# Patient Record
Sex: Male | Born: 1940 | Race: Black or African American | Hispanic: No | Smoking: Former smoker
Health system: Southern US, Community
[De-identification: ages and names within clinical notes are randomized; demographics above are authoritative.]

## PROBLEM LIST (undated history)

## (undated) DIAGNOSIS — J45909 Unspecified asthma, uncomplicated: Secondary | ICD-10-CM

## (undated) DIAGNOSIS — J439 Emphysema, unspecified: Secondary | ICD-10-CM

## (undated) DIAGNOSIS — J449 Chronic obstructive pulmonary disease, unspecified: Secondary | ICD-10-CM

## (undated) DIAGNOSIS — Z87891 Personal history of nicotine dependence: Secondary | ICD-10-CM

## (undated) DIAGNOSIS — I10 Essential (primary) hypertension: Secondary | ICD-10-CM

## (undated) HISTORY — PX: HEMORRHOID SURGERY: SHX153

## (undated) HISTORY — DX: Personal history of nicotine dependence: Z87.891

## (undated) HISTORY — PX: OTHER SURGICAL HISTORY: SHX169

## (undated) HISTORY — DX: Emphysema, unspecified: J43.9

---

## 2014-08-02 ENCOUNTER — Ambulatory Visit: Payer: Self-pay | Admitting: Specialist

## 2014-08-02 LAB — BASIC METABOLIC PANEL
Anion Gap: 7 (ref 7–16)
BUN: 24 mg/dL — AB (ref 7–18)
CALCIUM: 9.3 mg/dL (ref 8.5–10.1)
CO2: 26 mmol/L (ref 21–32)
CREATININE: 1.29 mg/dL (ref 0.60–1.30)
Chloride: 104 mmol/L (ref 98–107)
EGFR (African American): >60
EGFR (Non-African Amer.): 58 — ABNORMAL LOW
Glucose: 86 mg/dL (ref 65–99)
Osmolality: 277 (ref 275–301)
Potassium: 4.5 mmol/L (ref 3.5–5.1)
Sodium: 137 mmol/L (ref 136–145)

## 2014-08-02 LAB — CBC
HCT: 39.8 % — ABNORMAL LOW (ref 40.0–52.0)
HGB: 12.9 g/dL — ABNORMAL LOW (ref 13.0–18.0)
MCH: 29.2 pg (ref 26.0–34.0)
MCHC: 32.5 g/dL (ref 32.0–36.0)
MCV: 90 fL (ref 80–100)
Platelet: 330 10*3/uL (ref 150–440)
RBC: 4.42 10*6/uL (ref 4.40–5.90)
RDW: 14.5 % (ref 11.5–14.5)
WBC: 9.1 10*3/uL (ref 3.8–10.6)

## 2014-08-02 LAB — URINALYSIS, COMPLETE
BILIRUBIN, UR: NEGATIVE
BLOOD: NEGATIVE
Bacteria: NONE SEEN
GLUCOSE, UR: NEGATIVE mg/dL (ref 0–75)
Ketone: NEGATIVE
LEUKOCYTE ESTERASE: NEGATIVE
Nitrite: NEGATIVE
PROTEIN: NEGATIVE
Ph: 5 (ref 4.5–8.0)
SPECIFIC GRAVITY: 1.012 (ref 1.003–1.030)
SQUAMOUS EPITHELIAL: NONE SEEN

## 2014-08-02 LAB — PROTIME-INR
INR: 1
PROTHROMBIN TIME: 13.2 s (ref 11.5–14.7)

## 2014-08-02 LAB — APTT: Activated PTT: 29.1 secs (ref 23.6–35.9)

## 2014-08-02 LAB — MRSA PCR SCREENING

## 2014-08-07 DIAGNOSIS — R0789 Other chest pain: Secondary | ICD-10-CM | POA: Insufficient documentation

## 2014-08-16 ENCOUNTER — Inpatient Hospital Stay: Payer: Self-pay | Admitting: Specialist

## 2014-08-16 DIAGNOSIS — I10 Essential (primary) hypertension: Secondary | ICD-10-CM

## 2014-08-16 DIAGNOSIS — Z01810 Encounter for preprocedural cardiovascular examination: Secondary | ICD-10-CM

## 2014-08-16 LAB — BASIC METABOLIC PANEL
Anion Gap: 6 — ABNORMAL LOW (ref 7–16)
BUN: 20 mg/dL — AB (ref 7–18)
CHLORIDE: 109 mmol/L — AB (ref 98–107)
CREATININE: 1.33 mg/dL — AB (ref 0.60–1.30)
Calcium, Total: 7.9 mg/dL — ABNORMAL LOW (ref 8.5–10.1)
Co2: 27 mmol/L (ref 21–32)
EGFR (Non-African Amer.): 56 — ABNORMAL LOW
GLUCOSE: 89 mg/dL (ref 65–99)
Osmolality: 285 (ref 275–301)
Potassium: 4.3 mmol/L (ref 3.5–5.1)
Sodium: 142 mmol/L (ref 136–145)

## 2014-08-16 LAB — HEMOGLOBIN
HGB: 9.2 g/dL — AB (ref 13.0–18.0)
HGB: 9.3 g/dL — ABNORMAL LOW (ref 13.0–18.0)

## 2014-08-16 LAB — PLATELET COUNT: Platelet: 234 10*3/uL (ref 150–440)

## 2014-08-17 LAB — HEMOGLOBIN: HGB: 8.5 g/dL — AB (ref 13.0–18.0)

## 2014-08-18 DIAGNOSIS — R0902 Hypoxemia: Secondary | ICD-10-CM | POA: Diagnosis not present

## 2014-08-18 DIAGNOSIS — J8410 Pulmonary fibrosis, unspecified: Secondary | ICD-10-CM

## 2014-08-18 LAB — HEMOGLOBIN: HGB: 10.6 g/dL — ABNORMAL LOW (ref 13.0–18.0)

## 2014-08-22 ENCOUNTER — Emergency Department: Payer: Self-pay | Admitting: Emergency Medicine

## 2014-08-22 LAB — CBC WITH DIFFERENTIAL/PLATELET
BASOS ABS: 0.1 10*3/uL (ref 0.0–0.1)
Basophil %: 0.5 %
EOS PCT: 2.3 %
Eosinophil #: 0.2 10*3/uL (ref 0.0–0.7)
HCT: 31.7 % — AB (ref 40.0–52.0)
HGB: 10.3 g/dL — ABNORMAL LOW (ref 13.0–18.0)
LYMPHS ABS: 2.2 10*3/uL (ref 1.0–3.6)
Lymphocyte %: 20.8 %
MCH: 30 pg (ref 26.0–34.0)
MCHC: 32.5 g/dL (ref 32.0–36.0)
MCV: 92 fL (ref 80–100)
MONO ABS: 1.2 x10 3/mm — AB (ref 0.2–1.0)
Monocyte %: 11.1 %
Neutrophil #: 7 10*3/uL — ABNORMAL HIGH (ref 1.4–6.5)
Neutrophil %: 65.3 %
PLATELETS: 368 10*3/uL (ref 150–440)
RBC: 3.44 10*6/uL — ABNORMAL LOW (ref 4.40–5.90)
RDW: 13.5 % (ref 11.5–14.5)
WBC: 10.8 10*3/uL — AB (ref 3.8–10.6)

## 2015-01-29 ENCOUNTER — Inpatient Hospital Stay
Admit: 2015-01-29 | Disposition: A | Discharge status: Home / Self Care | Payer: Self-pay | Attending: Internal Medicine | Admitting: Internal Medicine

## 2015-01-29 LAB — COMPREHENSIVE METABOLIC PANEL
ALT: 18 U/L
AST: 59 U/L — AB
Albumin: 2.8 g/dL — ABNORMAL LOW
Alkaline Phosphatase: 32 U/L — ABNORMAL LOW
Anion Gap: 18 — ABNORMAL HIGH (ref 7–16)
BUN: 70 mg/dL — AB
Bilirubin,Total: 0.3 mg/dL
Calcium, Total: 8.5 mg/dL — ABNORMAL LOW
Chloride: 106 mmol/L
Co2: 14 mmol/L — ABNORMAL LOW
Creatinine: 2.38 mg/dL — ABNORMAL HIGH
GFR CALC AF AMER: 30 — AB
GFR CALC NON AF AMER: 26 — AB
Glucose: 160 mg/dL — ABNORMAL HIGH
Potassium: 4.8 mmol/L
SODIUM: 138 mmol/L
TOTAL PROTEIN: 5.2 g/dL — AB

## 2015-01-29 LAB — HEMOGLOBIN
HGB: 9 g/dL — ABNORMAL LOW (ref 13.0–18.0)
HGB: 7.5 g/dL — AB (ref 13.0–18.0)
HGB: 8.9 g/dL — ABNORMAL LOW (ref 13.0–18.0)

## 2015-01-29 LAB — CBC
HCT: 20.4 % — AB (ref 40.0–52.0)
HGB: 6.2 g/dL — AB (ref 13.0–18.0)
MCH: 25.8 pg — AB (ref 26.0–34.0)
MCHC: 30.5 g/dL — ABNORMAL LOW (ref 32.0–36.0)
MCV: 84 fL (ref 80–100)
Platelet: 211 10*3/uL (ref 150–440)
RBC: 2.42 10*6/uL — AB (ref 4.40–5.90)
RDW: 19 % — AB (ref 11.5–14.5)
WBC: 13.9 10*3/uL — AB (ref 3.8–10.6)

## 2015-01-29 LAB — CK TOTAL AND CKMB (NOT AT ARMC)
CK, Total: 217 U/L
CK, Total: 243 U/L
CK-MB: 5.9 ng/mL — ABNORMAL HIGH
CK-MB: 6.8 ng/mL — AB

## 2015-01-29 LAB — TROPONIN I
TROPONIN-I: 0.16 ng/mL — AB
TROPONIN-I: 0.17 ng/mL — AB

## 2015-01-29 LAB — APTT: ACTIVATED PTT: 26 s (ref 23.6–35.9)

## 2015-01-29 LAB — PROTIME-INR
INR: 1.3
Prothrombin Time: 15.9 secs — ABNORMAL HIGH

## 2015-01-30 LAB — COMPREHENSIVE METABOLIC PANEL
ALK PHOS: 26 U/L — AB
ANION GAP: 4 — AB (ref 7–16)
Albumin: 2.7 g/dL — ABNORMAL LOW
BILIRUBIN TOTAL: 0.6 mg/dL
BUN: 57 mg/dL — ABNORMAL HIGH
CALCIUM: 7.6 mg/dL — AB
CO2: 20 mmol/L — AB
Chloride: 115 mmol/L — ABNORMAL HIGH
Creatinine: 1.54 mg/dL — ABNORMAL HIGH
EGFR (African American): 51 — ABNORMAL LOW
GFR CALC NON AF AMER: 44 — AB
Glucose: 91 mg/dL
POTASSIUM: 4 mmol/L
SGOT(AST): 22 U/L
SGPT (ALT): 13 U/L — ABNORMAL LOW
Sodium: 139 mmol/L
Total Protein: 4.6 g/dL — ABNORMAL LOW

## 2015-01-30 LAB — CBC WITH DIFFERENTIAL/PLATELET
Basophil #: 0 10*3/uL (ref 0.0–0.1)
Basophil %: 0.3 %
EOS ABS: 0 10*3/uL (ref 0.0–0.7)
EOS PCT: 0.3 %
HCT: 25 % — AB (ref 40.0–52.0)
HGB: 8.2 g/dL — ABNORMAL LOW (ref 13.0–18.0)
LYMPHS ABS: 2.6 10*3/uL (ref 1.0–3.6)
Lymphocyte %: 21.3 %
MCH: 27.4 pg (ref 26.0–34.0)
MCHC: 32.7 g/dL (ref 32.0–36.0)
MCV: 84 fL (ref 80–100)
MONOS PCT: 9.2 %
Monocyte #: 1.1 x10 3/mm — ABNORMAL HIGH (ref 0.2–1.0)
Neutrophil #: 8.5 10*3/uL — ABNORMAL HIGH (ref 1.4–6.5)
Neutrophil %: 68.9 %
Platelet: 143 10*3/uL — ABNORMAL LOW (ref 150–440)
RBC: 2.98 10*6/uL — AB (ref 4.40–5.90)
RDW: 17.9 % — AB (ref 11.5–14.5)
WBC: 12.4 10*3/uL — AB (ref 3.8–10.6)

## 2015-01-30 LAB — HEMOGLOBIN
HGB: 7.7 g/dL — AB (ref 13.0–18.0)
HGB: 10 g/dL — ABNORMAL LOW (ref 13.0–18.0)
HGB: 10.5 g/dL — ABNORMAL LOW (ref 13.0–18.0)

## 2015-01-30 LAB — CK TOTAL AND CKMB (NOT AT ARMC)
CK, TOTAL: 245 U/L
CK-MB: 6.4 ng/mL — AB

## 2015-01-30 LAB — HEMATOCRIT
HCT: 23.7 % — ABNORMAL LOW (ref 40.0–52.0)
HCT: 30.6 % — ABNORMAL LOW (ref 40.0–52.0)

## 2015-01-30 LAB — TROPONIN I: Troponin-I: 0.17 ng/mL — ABNORMAL HIGH

## 2015-01-31 LAB — HEMOGLOBIN: HGB: 9.3 g/dL — AB (ref 13.0–18.0)

## 2015-01-31 LAB — CBC WITH DIFFERENTIAL/PLATELET
Basophil #: 0.1 10*3/uL (ref 0.0–0.1)
Basophil %: 0.5 %
EOS PCT: 2.9 %
Eosinophil #: 0.3 10*3/uL (ref 0.0–0.7)
HCT: 26.7 % — AB (ref 40.0–52.0)
HGB: 9 g/dL — ABNORMAL LOW (ref 13.0–18.0)
LYMPHS ABS: 3.3 10*3/uL (ref 1.0–3.6)
Lymphocyte %: 27.8 %
MCH: 28.1 pg (ref 26.0–34.0)
MCHC: 33.9 g/dL (ref 32.0–36.0)
MCV: 83 fL (ref 80–100)
MONO ABS: 1 x10 3/mm (ref 0.2–1.0)
MONOS PCT: 8.8 %
NEUTROS ABS: 7 10*3/uL — AB (ref 1.4–6.5)
Neutrophil %: 60 %
Platelet: 152 10*3/uL (ref 150–440)
RBC: 3.22 10*6/uL — ABNORMAL LOW (ref 4.40–5.90)
RDW: 17.4 % — ABNORMAL HIGH (ref 11.5–14.5)
WBC: 11.7 10*3/uL — ABNORMAL HIGH (ref 3.8–10.6)

## 2015-01-31 LAB — BASIC METABOLIC PANEL
Anion Gap: 8 (ref 7–16)
BUN: 31 mg/dL — ABNORMAL HIGH
CO2: 21 mmol/L — AB
Calcium, Total: 7.7 mg/dL — ABNORMAL LOW
Chloride: 111 mmol/L
Creatinine: 1.33 mg/dL — ABNORMAL HIGH
EGFR (African American): >60
EGFR (Non-African Amer.): 53 — ABNORMAL LOW
Glucose: 56 mg/dL — ABNORMAL LOW
Potassium: 3.4 mmol/L — ABNORMAL LOW
Sodium: 140 mmol/L

## 2015-02-01 LAB — HEMOGLOBIN: HGB: 10 g/dL — AB (ref 13.0–18.0)

## 2015-02-02 LAB — HEMOGLOBIN: HGB: 10.2 g/dL — ABNORMAL LOW (ref 13.0–18.0)

## 2015-02-04 NOTE — Consult Note (Signed)
PATIENT NAME:  Jonathan Howell, Jonathan Howell MR#:  [id number] DATE OF BIRTH:  Sep 21, 1941  DATE OF CONSULTATION:  08/16/2014  REFERRING PHYSICIAN:  Margaretmary Eddy, MD CONSULTING PHYSICIAN:  Theodoro Grist, MD  PRIMARY CARE PHYSICIAN: Dr. Kendall Flack  REASON FOR CONSULTATION: Hypotension.   HISTORY OF PRESENT ILLNESS: The patient is a 74 year old African American male with past medical history significant for history of right hip severe degenerative arthritis who just underwent total hip joint replacement on the 3rd of November 2015. He was noted to be hypotensive in postoperative period. Initially, apparently, his vital signs were good with blood pressure around 120s and heart rate of 95 prior to going to the operating room; however, after operation the patient was noted to be hypotensive with systolic blood pressure in 60s. He was given 3600 mL of IV fluids. He had 800 mL of output, 150 of them were in Autovac and he had 75 to 50 mL of urine output during the operation time and postoperative period in PAC-U. Here now on the floor, he has been on the floor for approximately 1 hour, and his urine output is around 27 mL. Hospitalist services were contacted for evaluation as the patient's blood pressure remains 89/59 with heart rate of 115 and respiration rate of 14 with O2 sats of 95. The patient himself tells me that his blood pressure usually runs quite low, around 90s to 100s. He denies any shortness of breath, chest pains, lightheadedness or dizziness, or any other symptoms.   PAST MEDICAL HISTORY: Significant for history of right hip severe degenerative arthritis for which he just underwent total right hip replacement, depression, hyperlipidemia, and joint pain. Apparently, the patient had left total hip replacement approximately 7 years ago for degenerative arthritis as well.   HOME MEDICATIONS: Fluoxetine 10 mg p.o. twice daily, Norco 5/325 mg 1 tablet every 3 to 4 hours as needed, Seroquel 300 mg p.o. at bedtime.    ALLERGIES: No known drug allergies.   PAST SURGICAL HISTORY: As above.   FAMILY HISTORY: Diabetes mellitus in family, also the patient's mother had breast carcinoma. No other coronary artery disease, strokes, or hypertension in the family.   SOCIAL HISTORY: The patient is a smoker, smokes approximately 1 pack in 3 days, has been smoking for 50 or 60 years. No alcohol abuse. Used to work in [employer].   REVIEW OF SYSTEMS: Positive for intermittent cough as well as wheezes as well as white phlegm.  CONSTITUTIONAL: Denies any fevers, chills, fatigue, weakness, and pain, except for now postoperatively. No weight loss or gain.  EYES: Denies any blurry vision, double vision, glaucoma or cataracts.  ENT: Denies any tinnitus, allergies, epistaxis, sinus pain, dentures, difficulty swallowing. RESPIRATORY: Admits of cough and wheezes. Denies any hemoptysis. Denies any shortness of breath. Denies any COPD. CARDIOVASCULAR: Denies chest pains, orthopnea, edema, arrhythmias, palpitations or syncope.  GASTROINTESTINAL: Denies nausea, vomiting, diarrhea, or constipation.  GENITOURINARY: Denies dysuria, hematuria, frequency or incontinence.  ENDOCRINE: Denies any polydipsia, nocturia, thyroid problems, heat or cold intolerance or thirst.  HEMATOLOGIC: Denies anemia, easy bruising, bleeding or swollen glands.  SKIN: Denies any acne, rash, lesions, or change in moles.  MUSCULOSKELETAL: Denies arthritis, cramps or swelling. NEUROLOGIC: No numbness, epilepsy, tremors.  PSYCHIATRIC: Denies anxiety, insomnia, or depression.  PHYSICAL EXAMINATION:  VITAL SIGNS: During my evaluation, the patient's temperature is 97.7, pulse 102, respirations 18, blood pressure 106/73, and saturation was 91% on room air. Just half an hour ago, the patient's systolic blood pressure was 88/64.  GENERAL: This  is a well-developed, well-nourished [place] American male in no significant distress, lying on the stretcher.  HEENT: His  pupils are equal and reactive to light. Extraocular movements intact. No scleral icterus or conjunctivitis. Has normal hearing. No pharyngeal erythema. Mucosa is moist.  NECK: No masses. Supple and nontender. Thyroid is not enlarged. No adenopathy or JVD or carotid bruits bilaterally. Full range of motion.  LUNGS: Diminished breath sounds bilaterally. No rales or rhonchi; however, the patient does have significant wheezing, especially on the right side. No significant labored inspirations, increased effort. No dullness to percussion. Not in overt respiratory distress.  HEART: S1, S2 appreciated. Rhythm was regular. PMI not lateralized. Chest is nontender to palpation. 1+ pedal pulses. EXTREMITIES: No lower extremity edema, calf tenderness or cyanosis was noted.  ABDOMEN: Soft, nontender. Positive bowel sounds. No hepatosplenomegaly or masses were noted. RECTAL: Deferred.  MUSCLE STRENGTH: Not able to move his right lower extremity; however, all other extremities are functioning well. No cyanosis. Not able to assess for kyphosis. The patient does have Autovac with 150 mL of blood.  SKIN: Did not reveal any rashes, lesions, erythema, nodularity or induration. It was warm and dry to palpation.  LYMPHATIC: No adenopathy in the cervical region.  NEUROLOGICAL: Cranial nerves grossly intact. Sensory is intact. No dysarthria or aphasia. The patient is alert, oriented to time, person and place, cooperative. Memory is good.  PSYCHIATRIC: No significant confusion, agitation, or depression noted.  DIAGNOSTIC DATA: No labs yet available.   Radiologic studies: Right hip, 1 view x-ray, 3rd of November 2015, status post hip replacement, showed right hip joint prosthesis placement without evidence of immediate postprocedure complications.   Pelvis AP only, 3rd of November 2015, the same.  ASSESSMENT AND PLAN: 1.  Hypotension. Very likely secondary to intravascular volume contraction, questionable intraoperative  bleeding also spinal anesthesia in the setting of chronic hypotension with tachycardia, with oliguria of approximately 27 mL an hour of urinary output. We will start the patient on IV fluids. We will give him bolus. Will transfuse blood as needed. We will get hemoglobin level stat now and we will crossmatch and transfuse if needed.  2.  Depression. We will continue Seroquel as well as fluoxetine. 3.  Hyperlipidemia. Low-fat diet. 4.  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 Will initiate the patient on DuoNebs and tiotropium. 5.  Tobacco abuse. Discussed cessation for approximately 5 minutes. Nicotine replacement therapy will be initiated.   TIME SPENT: 50 minutes.  ____________________________ Theodoro Grist, MD rv:sb D: 08/16/2014 13:39:15 ET T: 08/16/2014 14:36:12 ET JOB#: [id number]  cc: Theodoro Grist, MD, <Dictator> Leyton Magoon MD ELECTRONICALLY SIGNED 09/12/2014 21:07

## 2015-02-04 NOTE — Consult Note (Signed)
Brief Consult Note: Diagnosis: hypotension, s/p R total hip arthroplasty 11.3.15 by DR C. Smith, h/o depression, hyperlipidemia, ongoing tobacco abuse.   Patient was seen by consultant.   Consult note dictated.   Recommend further assessment or treatment.   Orders entered.   Comments: 1. HYpotension, poss related to intravascular volume contraction, ? intraoperative bleeding, spinal anestesia in the setting on chronic hypotension, with tachycardia, relative oliguria of 27 ml/hour urinary output, start IVF, bolus, transfuse blood if needed, crossmatch, get stat HGb  2.depression, contineu seroquel, fluoxetine 3. hyperlipidemia, low fat diet 4 wheezing, suspect COPD due to tobacco abuse, duonebs, tiotropium, O2 prn 5. tobacco abuse, d/w pt for about 5 minutes, nicotine replacement will be initiated Thanks for consult, will follow.  Electronic Signatures: Theodoro Grist (MD)  (Signed [phone] 13:40)  Authored: Brief Consult Note   Last Updated: 03-Nov-15 13:40 by Theodoro Grist (MD)

## 2015-02-04 NOTE — Op Note (Signed)
PATIENT NAME:  Jonathan Howell, Jonathan Howell MR#:  [id number] DATE OF BIRTH:  03-May-1941  DATE OF PROCEDURE:  08/16/2014  PREOPERATIVE DIAGNOSIS: Severe degenerative arthritis, right hip.  POSTOPERATIVE DIAGNOSIS: Severe degenerative arthritis, right hip.  PROCEDURE: Right total hip arthroplasty.   SURGEON: Christophe Louis, M.D.   ASSISTANT: Earnestine Leys, M.D.   ANESTHESIA: Spinal.   ESTIMATED BLOOD LOSS: 500 mL.   DRAINS: Two Autovac drains.   DESCRIPTION OF PROCEDURE: Two grams of Ancef was given intravenously prior to the procedure. Spinal anesthesia is induced. The patient is turned into the left lateral decubitus position and secured in the usual manner for right total hip arthroplasty. The right hip and leg are thoroughly prepped with alcohol and ChloraPrep and draped in standard sterile fashion. A standard posterolateral incision is made at the hip and the dissection carried down and the fascia lata incised in the line of the skin incision. The trochanteric bursa is excised. The sciatic nerve is palpated deep within the wound and protected throughout the case. Piriformis tendon is identified and cut and tagged. The remainder of the external rotators are cut with the coagulation Bovie. The capsule is thoroughly then dissected out using the periosteal elevator. It is then cut off of the femur in a T fashion and the ends are tagged with 0 Tycron. The hip is then easily dislocated and the femoral head cut off at the appropriate angle and length. The femoral head measures 51 mm. Acetabular retractors are placed, and the acetabulum is serially reamed to 53 mm and then the 54 mm tri-spike porous-coated AML acetabulum is impacted into place and seen to be in good position. Trial liner is inserted. The proximal femur is then reamed in standard fashion up to 11.5 mm in diameter. Broaches are used up to 12 and then trial reduction is performed with a -2 neck and this is seen to be the appropriate fit. All trial  components are removed. The wound is thoroughly irrigated multiple times throughout the case. Hole eliminator is placed. The acetabular liner with a 10 degree build-up is placed with the build-up posterolaterally. The small stature 12 mm AML porous-coated femoral component is then impacted into position and seen to be a good fit. The -2 ball is placed on the neck and impacted and then the hip is reduced and is seen to be stable and satisfactory with equal leg lengths. The joint is thoroughly irrigated multiple times. The capsule is meticulously repaired with zero Tycron and then resecured to the abductor mechanism. The piriformis tendon is resecured to the abductor mechanism. Two Autovac drains are brought out through separate stab wound incisions. The fascia lata is repaired with 0 Tycron, subcutaneous tissue closed with 2-0 Vicryl and the skin stapler is used to close the skin. A soft bulky dressing is applied. The patient is turned onto the hospital bed and placed in an abduction pillow and returned to the recovery room in satisfactory condition having tolerated the procedure quite well.  ____________________________ Lucas Mallow, MD ces:sb D: 08/16/2014 10:38:23 ET T: 08/16/2014 11:19:31 ET JOB#: [id number]  cc: Lucas Mallow, MD, <Dictator> Lucas Mallow MD ELECTRONICALLY SIGNED 08/17/2014 8:40

## 2015-02-06 LAB — SURGICAL PATHOLOGY

## 2015-02-07 ENCOUNTER — Ambulatory Visit
Admit: 2015-02-07 | Disposition: A | Discharge status: Home / Self Care | Payer: Self-pay | Attending: Family Medicine | Admitting: Family Medicine

## 2015-02-09 ENCOUNTER — Emergency Department
Admit: 2015-02-09 | Disposition: A | Discharge status: Home / Self Care | Payer: Self-pay | Admitting: Emergency Medicine

## 2015-02-09 LAB — BASIC METABOLIC PANEL
Anion Gap: 8 (ref 7–16)
BUN: 17 mg/dL
CHLORIDE: 103 mmol/L
Calcium, Total: 8.8 mg/dL — ABNORMAL LOW
Co2: 25 mmol/L
Creatinine: 1.15 mg/dL
EGFR (African American): >60
EGFR (Non-African Amer.): >60
Glucose: 98 mg/dL
Potassium: 3.5 mmol/L
Sodium: 136 mmol/L

## 2015-02-09 LAB — CBC WITH DIFFERENTIAL/PLATELET
BASOS ABS: 0.1 10*3/uL (ref 0.0–0.1)
Basophil %: 1.4 %
EOS PCT: 3.5 %
Eosinophil #: 0.3 10*3/uL (ref 0.0–0.7)
HCT: 32.7 % — ABNORMAL LOW (ref 40.0–52.0)
HGB: 10.8 g/dL — ABNORMAL LOW (ref 13.0–18.0)
LYMPHS ABS: 2.4 10*3/uL (ref 1.0–3.6)
Lymphocyte %: 25.1 %
MCH: 28.2 pg (ref 26.0–34.0)
MCHC: 33.1 g/dL (ref 32.0–36.0)
MCV: 85 fL (ref 80–100)
MONO ABS: 0.7 x10 3/mm (ref 0.2–1.0)
MONOS PCT: 7.3 %
NEUTROS PCT: 62.7 %
Neutrophil #: 6.1 10*3/uL (ref 1.4–6.5)
Platelet: 523 10*3/uL — ABNORMAL HIGH (ref 150–440)
RBC: 3.84 10*6/uL — AB (ref 4.40–5.90)
RDW: 18 % — AB (ref 11.5–14.5)
WBC: 9.7 10*3/uL (ref 3.8–10.6)

## 2015-02-09 LAB — URINALYSIS, COMPLETE
Bacteria: NONE SEEN
Bilirubin,UR: NEGATIVE
Glucose,UR: NEGATIVE mg/dL (ref 0–75)
Ketone: NEGATIVE
Leukocyte Esterase: NEGATIVE
Nitrite: NEGATIVE
Ph: 7 (ref 4.5–8.0)
Protein: NEGATIVE
Specific Gravity: 1.011 (ref 1.003–1.030)

## 2015-02-09 LAB — TROPONIN I

## 2015-02-11 LAB — URINE CULTURE

## 2015-02-12 NOTE — Consult Note (Signed)
Chief Complaint:  Subjective/Chief Complaint Pt denies nausea, vomiting, abdominal pain or melena.  No BM in past 24 hrs.  Hgb 8.2.   VITAL SIGNS/ANCILLARY NOTES: **Vital Signs.:   18-Apr-16 08:00  Temperature Temperature (F) 98.3  Celsius 36.8  Temperature Source oral  Pulse Pulse 107  Respirations Respirations 15  Systolic BP Systolic BP 97  Diastolic BP (mmHg) Diastolic BP (mmHg) 73  Mean BP 81  Pulse Ox % Pulse Ox % 97  Oxygen Delivery Room Air/ 21 %   Brief Assessment:  GEN well developed, well nourished, no acute distress, A/Ox3   Cardiac Regular   Respiratory normal resp effort   Gastrointestinal Normal   Gastrointestinal details normal Soft  Nontender  Bowel sounds normal  No rebound tenderness  No gaurding  No rigidity   EXTR negative cyanosis/clubbing, negative edema   Additional Physical Exam Skin: warm, dry   Lab Results:  Hepatic:  18-Apr-16 04:44   Bilirubin, Total 0.6 (0.3-1.2 NOTE: New Reference Range  12/20/14)  Alkaline Phosphatase  26 (38-126 NOTE: New Reference Range  12/20/14)  SGPT (ALT)  13 (17-63 NOTE: New Reference Range  12/20/14)  SGOT (AST) 22 (15-41 NOTE: New Reference Range  12/20/14)  Total Protein, Serum  4.6 (6.5-8.1 NOTE: New Reference Range  12/20/14)  Albumin, Serum  2.7 (3.5-5.0 NOTE: New reference range  12/20/14)  Routine Chem:  18-Apr-16 04:44   Glucose, Serum 91 (65-99 NOTE: New Reference Range  12/20/14)  Creatinine (comp)  1.54 (0.61-1.24 NOTE: New Reference Range  12/20/14)  Sodium, Serum 139 (135-145 NOTE: New Reference Range  12/20/14)  Potassium, Serum 4.0 (3.5-5.1 NOTE: New Reference Range  12/20/14)  Chloride, Serum  115 (101-111 NOTE: New Reference Range  12/20/14)  CO2, Serum  20 (22-32 NOTE: New Reference Range  12/20/14)  Calcium (Total), Serum  7.6 (8.9-10.3 NOTE: New Reference Range  12/20/14)  eGFR (African American)  51  eGFR (Non-African American)  44 (eGFR values <83m/min/1.73 m2  may be an indication of chronic kidney disease (CKD). Calculated eGFR is useful in patients with stable renal function. The eGFR calculation will not be reliable in acutely ill patients when serum creatinine is changing rapidly. It is not useful in patients on dialysis. The eGFR calculation may not be applicable to patients at the low and high extremes of body sizes, pregnant women, and vegetarians.)  Anion Gap  4  Routine Hem:  18-Apr-16 04:44   WBC (CBC)  12.4  RBC (CBC)  2.98  Hemoglobin (CBC)  8.2  Hematocrit (CBC)  25.0  Platelet Count (CBC)  143  MCV 84  MCH 27.4  MCHC 32.7  RDW  17.9  Neutrophil % 68.9  Lymphocyte % 21.3  Monocyte % 9.2  Eosinophil % 0.3  Basophil % 0.3  Neutrophil #  8.5  Lymphocyte # 2.6  Monocyte #  1.1  Eosinophil # 0.0  Basophil # 0.0 (Result(s) reported on 30 Jan 2015 at 05:33AM.)   Assessment/Plan:  Assessment/Plan:  Assessment UGI Bleed secondary to DU:  Resolved s/p intevention yesterday.   Pt doing well. 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   Plan 1) Advance to clear liquids as tolerated 2) continue PPI drip, will need to DC home on Protonix 483mBID 3) AVOID ASA/NSAIDS 4) Follow H/H Please call if you have any questions or concerns   Electronic Signatures: JoAndria MeuseNP)  (Signed 18-Apr-16 09:31)  Authored: Chief Complaint, VITAL SIGNS/ANCILLARY NOTES, Brief Assessment, Lab Results, Assessment/Plan   Last Updated: 18-Apr-16 09:31 by Andria Meuse (NP)

## 2015-02-12 NOTE — Consult Note (Signed)
PATIENT NAME:  Jonathan Howell, Jonathan Howell MR#:  [id number] DATE OF BIRTH:  1941-08-24 DATE OF ADMISSION:  01/29/2015.  GASTROENTEROLOGY CONSULTATION  DATE OF CONSULTATION:  01/29/2015.  CONSULTING SERVICE:  Gastroenterology.  CONSULTING PHYSICIAN:  Lucilla Lame, MD.  REASON FOR CONSULTATION:  GI bleeding.   HISTORY OF PRESENT ILLNESS:  This patient is a 74 year old gentleman who was admitted with black and maroon stools.  The patient denies taking any nonsteroidal anti-inflammatory drugs, alcohol abuse, being on any blood thinners, or any other ulcerogenic medications.  The patient states he developed massive amounts of bleeding the morning of admission.  He was brought to the Emergency Room and continued to have melena.  He was found to be tachycardic and hypotensive with anemia.  The patient was found on admission to have a hemoglobin of 6.2, which came up to 9 after only 2 units of blood.  The patient's INR was 1.3.  There is no report of any history of bleeding in the past.  I am now being asked to see the patient for melena.   PAST MEDICAL HISTORY:  Anxiety, depression, COPD, tobacco use, status post right hip surgery.   MEDICATIONS:  Prozac, Seroquel.   ALLERGIES:  No known drug allergies.   SOCIAL HISTORY:  Smokes a half of a pack of cigarettes a day.  Denies alcohol abuse.   FAMILY HISTORY:  Noncontributory.   REVIEW OF SYSTEMS:  Ten-point review of systems was negative except what was stated above.   PHYSICAL EXAMINATION:  GENERAL:  The patient in the ICU in no apparent distress, speaking in full sentences, alert and oriented x 3.  VITAL SIGNS:  Temperature 97.7, pulse 104, respirations 20, blood pressure 142/95, pulse oximetry 97%.  HEENT:  Normocephalic, atraumatic.  Extraocular motor intact.  Pupils equal, round, and reactive to light and accommodation.   NECK:  Without JVD, without lymphadenopathy.  LUNGS:  Clear to auscultation bilaterally.  HEART:  Regular rate and rhythm without  murmurs, rubs, or gallops.  ABDOMEN:  Soft, nontender, nondistended, without hepatosplenomegaly.  EXTREMITIES:  Without cyanosis, clubbing, or edema.  NEUROLOGICAL:  Grossly intact.  SKIN:  Without any rashes or lesions.   ASSESSMENT AND PLAN:  This patient is a 74 year old gentleman who has an acute gastrointestinal bleed with a nasogastric tube placed in the Emergency Department that showed coffee-ground material.  The patient will be set up for an esophagogastroduodenoscopy today with treatment of any obvious bleeding spots. The patient has been explained the plan and he agrees with it.   Thank you very much for involving me in the care of this patient.  If you have any questions, please do not hesitate to call.     ____________________________ Lucilla Lame, MD dw:kc D: 01/29/2015 18:41:30 ET T: 01/29/2015 18:50:11 ET JOB#: [id number]  cc: Lucilla Lame, MD, <Dictator> Lucilla Lame MD ELECTRONICALLY SIGNED 01/31/2015 7:16

## 2015-02-12 NOTE — Consult Note (Signed)
Chief Complaint:  Subjective/Chief Complaint No further melena.  Tolerated diet well yesterday with 1 dark stool in past 24 hrs.  Denies nausea or vomiting.   VITAL SIGNS/ANCILLARY NOTES: **Vital Signs.:   21-Apr-16 07:26  Vital Signs Type Routine  Temperature Temperature (F) 98.3  Temperature Source oral  Pulse Pulse 106  Respirations Respirations 18  Systolic BP Systolic BP 314  Diastolic BP (mmHg) Diastolic BP (mmHg) 89  Mean BP 111  Pulse Ox % Pulse Ox % 94  Pulse Ox Activity Level  At rest  Oxygen Delivery Room Air/ 21 %   Brief Assessment:  GEN well developed, well nourished, no acute distress, A/Ox3   Cardiac Regular   Respiratory normal resp effort   Gastrointestinal Normal   Gastrointestinal details normal Soft  Nontender  Bowel sounds normal  No rebound tenderness  No gaurding  No rigidity   EXTR negative cyanosis/clubbing, negative edema   Additional Physical Exam Skin: warm, dry   Lab Results: Routine Hem:  21-Apr-16 07:42   Hemoglobin (CBC)  10.2 (Result(s) reported on 02 Feb 2015 at 08:04AM.)   Assessment/Plan:  Assessment/Plan:  Assessment UGI Bleed secondary to DU:  Resolved s/p intevention.   Pt doing well.  Continued dark stools is old blood as hgb stable over past 3 days. Anemia: Hgb stable s/p transfusions. I have discussed his care with Dr Evangeline Gula [facility] & Dr Earleen Newport.   Plan 1) Advance to soft bland diet as tolerated 2) Miralax 17 grams to initiate BMS to clean out old melena Please call if you have any questions or concerns   Electronic Signatures: Andria Meuse (NP)  (Signed 21-Apr-16 10:21)  Authored: Chief Complaint, VITAL SIGNS/ANCILLARY NOTES, Brief Assessment, Lab Results, Assessment/Plan   Last Updated: 21-Apr-16 10:21 by Andria Meuse (NP)

## 2015-02-12 NOTE — Consult Note (Signed)
Chief Complaint:  Subjective/Chief Complaint Pt denies nausea, vomiting, abdominal pain.  He had large melanotic stool yesterday afternoon, nothing further since that time.   VITAL SIGNS/ANCILLARY NOTES: **Vital Signs.:   19-Apr-16 04:39  Vital Signs Type Routine  Temperature Temperature (F) 98.5  Temperature Source oral  Pulse Pulse 111  Respirations Respirations 18  Systolic BP Systolic BP 786  Diastolic BP (mmHg) Diastolic BP (mmHg) 86  Mean BP 103  Pulse Ox % Pulse Ox % 92  Pulse Ox Activity Level  At rest  Oxygen Delivery Room Air/ 21 %   Brief Assessment:  GEN well developed, well nourished, no acute distress, A/Ox3   Cardiac Regular   Respiratory normal resp effort   Gastrointestinal Normal   Gastrointestinal details normal Soft  Nontender  Bowel sounds normal  No rebound tenderness  No gaurding  No rigidity   EXTR negative cyanosis/clubbing, negative edema   Additional Physical Exam Skin: warm, dry   Lab Results: Routine Chem:  19-Apr-16 04:34   Glucose, Serum  56 (65-99 NOTE: New Reference Range  12/20/14)  BUN  31 (6-20 NOTE: New Reference Range  12/20/14)  Creatinine (comp)  1.33 (0.61-1.24 NOTE: New Reference Range  12/20/14)  Sodium, Serum 140 (135-145 NOTE: New Reference Range  12/20/14)  Potassium, Serum  3.4 (3.5-5.1 NOTE: New Reference Range  12/20/14)  Chloride, Serum 111 (101-111 NOTE: New Reference Range  12/20/14)  CO2, Serum  21 (22-32 NOTE: New Reference Range  12/20/14)  Calcium (Total), Serum  7.7 (8.9-10.3 NOTE: New Reference Range  12/20/14)  Anion Gap 8  eGFR (African American) >60  eGFR (Non-African American)  53 (eGFR values <27m/min/1.73 m2 may be an indication of chronic kidney disease (CKD). Calculated eGFR is useful in patients with stable renal function. The eGFR calculation will not be reliable in acutely ill patients when serum creatinine is changing rapidly. It is not useful in patients on dialysis. The eGFR  calculation may not be applicable to patients at the low and high extremes of body sizes, pregnant women, and vegetarians.)  Routine Hem:  19-Apr-16 04:34   WBC (CBC)  11.7  RBC (CBC)  3.22  Hemoglobin (CBC)  9.0  Hematocrit (CBC)  26.7  Platelet Count (CBC) 152  MCV 83  MCH 28.1  MCHC 33.9  RDW  17.4  Neutrophil % 60.0  Lymphocyte % 27.8  Monocyte % 8.8  Eosinophil % 2.9  Basophil % 0.5  Neutrophil #  7.0  Lymphocyte # 3.3  Monocyte # 1.0  Eosinophil # 0.3  Basophil # 0.1 (Result(s) reported on 31 Jan 2015 at 06:31AM.)   Assessment/Plan:  Assessment/Plan:  Assessment UGI Bleed secondary to DU:  Resolved s/p intevention.   Pt doing well. Anemia: Hgb stable s/p transfusions I have [facility] her care [facility] our plan of care is below.   Plan 1) Advance to clear liquids as tolerated 2) continue PPI BID 3) Watch for gross GI bleeding & follow H/H Please call if you have any questions or concerns   Electronic Signatures: JAndria Meuse(NP)  (Signed 19-Apr-16 09:14)  Authored: Chief Complaint, VITAL SIGNS/ANCILLARY NOTES, Brief Assessment, Lab Results, Assessment/Plan   Last Updated: 19-Apr-16 09:14 by JAndria Meuse(NP)

## 2015-02-12 NOTE — Consult Note (Signed)
Brief Consult Note: Diagnosis: Patient with melena and NG with coffee grounds..   Patient was seen by consultant.   Consult note dictated.   Comments: Patient with upper GI bleed.  No NSAID's or history of Etoh abuse. Will plan EGD for today..  Electronic Signatures: Lucilla Lame (MD)  (Signed 17-Apr-16 16:47)  Authored: Brief Consult Note   Last Updated: 17-Apr-16 16:47 by Lucilla Lame (MD)

## 2015-02-12 NOTE — Consult Note (Signed)
Chief Complaint:  Subjective/Chief Complaint Denies nausea, vomiting or abdominal pain.  no further melena.   VITAL SIGNS/ANCILLARY NOTES: **Vital Signs.:   20-Apr-16 05:31  Vital Signs Type Routine  Temperature Temperature (F) 98.7  Temperature Source oral  Pulse Pulse 112  Respirations Respirations 20  Systolic BP Systolic BP 159  Diastolic BP (mmHg) Diastolic BP (mmHg) 73  Mean BP 86  Pulse Ox % Pulse Ox % 92  Pulse Ox Activity Level  At rest  Oxygen Delivery Room Air/ 21 %   Brief Assessment:  GEN well developed, well nourished, no acute distress, A/Ox3   Cardiac Regular   Respiratory normal resp effort   Gastrointestinal Normal   Gastrointestinal details normal Soft  Nontender  Bowel sounds normal  No rebound tenderness  No gaurding  No rigidity   EXTR negative cyanosis/clubbing, negative edema   Additional Physical Exam Skin: warm, dry   Lab Results: Routine Hem:  20-Apr-16 07:12   Hemoglobin (CBC)  10.0 (Result(s) reported on 01 Feb 2015 at 07:50AM.)   Assessment/Plan:  Assessment/Plan:  Assessment UGI Bleed secondary to DU:  Resolved s/p intevention.   Pt doing well. Anemia: Hgb stable s/p transfusions I have discussed his care with Dr Evangeline [facility] & our plan of care is below.   Plan 1) Advance to soft bland diet as tolerated 2) continue supportive measures Please call if you have any questions or concerns   Electronic Signatures: Andria Meuse (NP)  (Signed 20-Apr-16 09:12)  Authored: Chief Complaint, VITAL SIGNS/ANCILLARY NOTES, Brief Assessment, Lab Results, Assessment/Plan   Last Updated: 20-Apr-16 09:12 by Andria Meuse (NP)

## 2015-02-12 NOTE — H&P (Signed)
PATIENT NAME:  Jonathan Howell, Jonathan Howell MR#:  [id number] DATE OF BIRTH:  [birth year]  DATE OF ADMISSION:  01/29/2015  REFERRING PHYSICIAN: Ahmed Prima, MD.  FAMILY PHYSICIAN: Turner Daniels, MD   REASON FOR ADMISSION: Gastrointestinal bleed.   HISTORY OF PRESENT ILLNESS: The patient is a 74 year old male with a history of COPD/tobacco abuse, as well as anxiety/depression, who has been constipated over the past several days. No abdominal pain. No nausea, vomiting, or diarrhea. The patient developed massive amounts of melena this morning. He was brought to the Emergency Room, where he continued to have melena. He was found to be tachycardic, hypotensive, and anemic. He is now admitted for further evaluation.   PAST MEDICAL HISTORY: 1. Anxiety/depression.  2. COPD/tobacco abuse.  3. Status post right hip surgery.   MEDICATIONS: 1. Prozac 20 mg p.o. b.i.d.  2. Seroquel 300 mg p.o. at bedtime.   ALLERGIES: No known drug allergies.   SOCIAL HISTORY: The patient smokes less than a half-pack per day. Denies alcohol abuse.   FAMILY HISTORY: Positive for hypertension, diabetes, and coronary artery disease. Negative for colon or prostate cancer.   REVIEW OF SYSTEMS:  CONSTITUTIONAL: No fever or change in weight.  EYES: No blurred or double vision. No glaucoma.  EARS, NOSE, AND THROAT: No tinnitus or hearing loss. No nasal discharge or bleeding. No difficulty swallowing.  RESPIRATORY: No cough or wheezing. Denies hemoptysis. No painful respiration.  CARDIOVASCULAR: No chest pain or orthopnea. No palpitations or syncope.  GASTROINTESTINAL: No nausea, vomiting, or diarrhea. No abdominal pain.  GENITOURINARY: No dysuria or hematuria. No incontinence.  ENDOCRINE: No polyuria or polydipsia. No heat or cold intolerance.  HEMATOLOGIC: The patient denies anemia, easy bruising.  LYMPHATIC: No swollen glands.  MUSCULOSKELETAL: The patient has some pain in his right hip, but denies neck, back, shoulder, or knee  pain. No gout.  NEUROLOGIC: No numbness or migraines. Denies stroke or seizures.  PSYCHOLOGICAL: The patient denies anxiety, insomnia, depression.   PHYSICAL EXAMINATION: GENERAL: The patient is in moderate distress.  VITAL SIGNS: Currently remarkable for a blood pressure of 90/78 with a heart rate of 109, respiratory rate of 16, temperature of 97.9, saturations 99% on room air.  HEENT: Normocephalic, atraumatic. Pupils equally round and reactive to light and accommodation. Extraocular movements are intact. Sclerae are anicteric. Conjunctivae are clear. Oropharynx is clear.  NECK: Supple without JVD. No adenopathy or thyromegaly is noted.  LUNGS: Reveal scattered rhonchi, without wheezes or rales. No dullness. Respiratory effort is normal.  CARDIAC: Rapid rate with a regular rhythm. Normal S1 and S2. No significant rubs or gallops. PMI is nondisplaced. Chest wall is nontender.  ABDOMEN: Soft, nontender with hyperactive bowel sounds. No organomegaly or masses are appreciated. No hernias or bruits were noted.  RECTAL: Revealed melanotic guaiac-positive stools per the Emergency Room physician.  EXTREMITIES: Without clubbing, cyanosis or edema. Pulses were 2+ bilaterally.  SKIN: Warm and dry without rash or lesions.  NEUROLOGIC: Cranial nerves II through XII grossly intact. Deep tendon reflexes were symmetric. Motor and sensory: Nonfocal.   PSYCHIATRIC: Revealed a patient who is alert and oriented to person, place, and time. He was cooperative and used good judgment.   LABORATORY DATA: EKG revealed sinus tachycardia at 109 beats per minute with no acute ischemic changes. His glucose was 160, with a BUN of 70, creatinine of 2.38, with a sodium of 138 and potassium of 4.8. GFR was 30. His AST was 59. His white count was 13.9 with a hemoglobin of 6.2.  ASSESSMENT: 1. Acute gastrointestinal bleed.  2. Acute blood loss anemia.  3. Hypotension.  4. Tachycardia.  5. Chronic obstructive pulmonary  disease.  6. Anxiety/depression.   PLAN: The patient will be admitted to the intensive care unit as a FULL CODE. He will be started on IV Protonix and transfused 2 units of packed red blood cells urgently. We will follow his hemoglobin closely. We will consult GI for further invasive evaluation. DuoNeb SVNs because of his underlying chronic obstructive pulmonary disease. N.p.o. except ice chips and medications. We will continue his anxiety/depression medications. Further treatment and evaluation will depend upon the patient's progress.   TOTAL TIME SPENT ON THIS PATIENT: 50 minutes.     ____________________________ Leonie Douglas Doy Hutching, MD jds:mw D: 01/29/2015 13:23:23 ET T: 01/29/2015 13:59:47 ET JOB#: [id number]  cc: Leonie Douglas. Doy Hutching, MD, <Dictator> Turner Daniels, MD Durrel Mcnee Lennice Sites MD ELECTRONICALLY SIGNED 01/30/2015 7:56

## 2015-02-12 NOTE — Discharge Summary (Signed)
PATIENT NAME:  Jonathan Howell, Jonathan Howell MR#:  [id number] DATE OF BIRTH:  [birth year]  DATE OF ADMISSION:  01/29/2015 DATE OF DISCHARGE:  02/02/2015  PRIMARY CARE PHYSICIAN:  Turner Daniels MD.   GASTROENTEROLOGIST:  Dr. Allen Norris.     FINAL DIAGNOSES:  1. Upper gastrointestinal bleed.  2. Acute hemorrhagic anemia.  3. Duodenal ulcer.  4. Relative hypotension.   MEDICATIONS ON DISCHARGE:  Include Seroquel 300 mg at bedtime, fluoxetine 20 mg twice a day, Tylenol 500 mg 4 times a day as needed for pain.  Stop taking aspirin.   DIET:  Regular diet, mechanical soft.   ACTIVITY:  As tolerated.   FOLLOWUP:  Follow up with Dr. Allen Norris, gastroenterology, in 2 weeks; 1-2 weeks with Turner Daniels.   HOSPITAL COURSE:  The patient was admitted 01/29/2015, discharged 02/02/2015.  Came in with a GI bleed, tachycardic, hypotensive, anemic.  Was admitted to the CCU for acute blood loss anemia, acute GI bleed, hypotension, and tachycardia.  The patient was started on IV Protonix.   Consultation was done by Dr. Allen Norris, gastroenterology, who did an upper endoscopy which showed normal esophagus, multiple nonbleeding angiectasias in the stomach, and one duodenal ulcer containing a visible vessel, injected.  The patient continued to have some maroon stools during the hospital course.  The patient was transfused 4 units of blood.  Hemoglobin upon discharge was up at 10.2.   LABORATORY AND RADIOLOGICAL DATA DURING THE HOSPITAL COURSE:  The patient had a PT of 15.9, INR 1.3, PTT 26.0.  Glucose 160, BUN 70, creatinine 2.38, sodium 138, potassium 4.8, chloride 106, CO2 of 14, calcium 8.5.  Liver function tests:  AST elevated at 5.9, total protein 5.2, albumin 2.8.  White blood cell count 13.9, hemoglobin 6.2, hematocrit 20.4, platelet count of 211,000.   EKG showed sinus tachycardia.  Troponin was borderline. Hemoglobin upon repeat after transfusion came up to 9, on the 18th dipped down to 7.7 again, and after transfusion up at 10.2.   HOSPITAL  COURSE PER PROBLEM LIST:  1. For the upper GI bleed, acute hemorrhagic anemia and duodenal ulcer, the patient had an endoscopy with cauterization; was on IV Protonix during the time here.   Will prescribe PPI upon discharge.  No aspirin, nonsteroidal anti-inflammatory drugs to be given.  2. For the relative hypotension and tachycardia, the patient remained with relative hypotension during the entire hospital course.  Blood pressure upon discharge 114/73.  Patient's heart rate remained on the elevated side the entire hospital course; 101 upon discharge.   3. The patient continued to have some maroon stools but this was believed to be old blood since the patient is hemodynamically stable.     I will have to call the patient at home to call in Protonix for him twice a day.   TIME SPENT ON DISCHARGE:  Thirty-five minutes.    ____________________________ Tana Conch. Leslye Peer, MD rjw:kc D: 02/02/2015 17:17:56 ET T: 02/02/2015 17:38:25 ET JOB#: [id number]  cc: Tana Conch. Leslye Peer, MD, <Dictator> Turner Daniels, MD Lucilla Lame, MD  Marisue Brooklyn MD ELECTRONICALLY SIGNED 02/03/2015 15:43

## 2015-02-17 ENCOUNTER — Other Ambulatory Visit
Admission: RE | Admit: 2015-02-17 | Discharge: 2015-02-17 | Disposition: A | Discharge status: Home / Self Care | Payer: Medicare Other | Source: Ambulatory Visit | Attending: Family Medicine | Admitting: Family Medicine

## 2015-02-17 DIAGNOSIS — K269 Duodenal ulcer, unspecified as acute or chronic, without hemorrhage or perforation: Secondary | ICD-10-CM | POA: Diagnosis present

## 2015-02-20 LAB — H PYLORI, IGM, IGG, IGA AB
H. Pylogi, Iga Abs: <9 units (ref 0.0–8.9)
H. Pylogi, Igm Abs: <9 units (ref 0.0–8.9)

## 2015-02-23 ENCOUNTER — Other Ambulatory Visit: Payer: Self-pay | Admitting: Family Medicine

## 2015-02-23 DIAGNOSIS — R918 Other nonspecific abnormal finding of lung field: Secondary | ICD-10-CM

## 2015-02-28 ENCOUNTER — Ambulatory Visit
Admission: RE | Admit: 2015-02-28 | Discharge: 2015-02-28 | Disposition: A | Discharge status: Home / Self Care | Payer: Medicare Other | Source: Ambulatory Visit | Attending: Family Medicine | Admitting: Family Medicine

## 2015-02-28 DIAGNOSIS — R918 Other nonspecific abnormal finding of lung field: Secondary | ICD-10-CM | POA: Insufficient documentation

## 2015-02-28 DIAGNOSIS — I2510 Atherosclerotic heart disease of native coronary artery without angina pectoris: Secondary | ICD-10-CM | POA: Insufficient documentation

## 2015-02-28 DIAGNOSIS — F17200 Nicotine dependence, unspecified, uncomplicated: Secondary | ICD-10-CM | POA: Insufficient documentation

## 2015-02-28 MED ORDER — IOHEXOL 300 MG/ML  SOLN
75.0000 mL | Freq: Once | INTRAMUSCULAR | Status: AC | PRN
  Administered 2015-02-28: 75 mL via INTRAVENOUS

## 2015-05-02 ENCOUNTER — Telehealth: Payer: Self-pay

## 2015-05-02 DIAGNOSIS — D509 Iron deficiency anemia, unspecified: Secondary | ICD-10-CM

## 2015-05-02 NOTE — Telephone Encounter (Signed)
Received Pharmacy Request for Ferrous Sulfate. Upon reading Jonathan Huger, NP last note, pt was only supposed to continue the Iron for 6 weeks.   Spoke with Ginger (Dr. Dorothey Baseman nurse) and patient will need CBC rechecked prior to reordering Iron.   CBC ordered at this time.  Call made to patient at this time. Voicemail left at this time to call office to get instructions for CBC to be drawn.

## 2015-05-03 NOTE — Telephone Encounter (Signed)
Called once again to patient's residence to explain instructions below.   Pt's daughter states that she will take the patient to have this lab test drawn on Monday.   I explained that I have placed order for lab and then our office will follow-up with next step and results as soon as they are received.   She verbalizes understanding.

## 2015-05-10 ENCOUNTER — Telehealth: Payer: Self-pay

## 2015-05-10 NOTE — Telephone Encounter (Addendum)
Patient was to have CBC redrawn prior to refilling Ferrous Sulfate. I have contacted patient's daughter on 05/02/15 to make her aware that patient needs these drawn.   Patient has still not had labs drawn.  Called once again today to reiterate this information to patient. No answer. Left Voicemail for return phone call with this information.

## 2015-07-04 ENCOUNTER — Telehealth: Payer: Self-pay | Admitting: *Deleted

## 2015-07-04 NOTE — Telephone Encounter (Signed)
  Oncology Nurse Navigator Documentation    Navigator Encounter Type: Screening;Telephone (07/04/15 1400)                          Notified Maudie Mercury, family member that lung cancer screening follow up CT scan is due. Confirmed that patient is within the age range of 55-77, and asymptomatic, (no signs or symptoms of lung cancer). The patient is a current smoker, with a 45 pack year history. The shared decision making visit was done with primary care in the past. Is in agreement with arrival to cancer center 07/14/15 at 115pm.

## 2015-07-14 ENCOUNTER — Encounter: Payer: Self-pay | Admitting: Family Medicine

## 2015-07-14 ENCOUNTER — Other Ambulatory Visit: Payer: Self-pay | Admitting: Family Medicine

## 2015-07-14 ENCOUNTER — Inpatient Hospital Stay: Payer: Medicare Other | Attending: Family Medicine | Admitting: Family Medicine

## 2015-07-14 DIAGNOSIS — Z87891 Personal history of nicotine dependence: Secondary | ICD-10-CM

## 2015-07-14 HISTORY — DX: Personal history of nicotine dependence: Z87.891

## 2016-05-06 ENCOUNTER — Emergency Department: Payer: Medicare Other

## 2016-05-06 ENCOUNTER — Encounter: Payer: Self-pay | Admitting: Emergency Medicine

## 2016-05-06 ENCOUNTER — Observation Stay
Admission: EM | Admit: 2016-05-06 | Discharge: 2016-05-09 | Disposition: A | Discharge status: Home / Self Care | Payer: Medicare Other | Attending: Internal Medicine | Admitting: Internal Medicine

## 2016-05-06 DIAGNOSIS — I77811 Abdominal aortic ectasia: Secondary | ICD-10-CM | POA: Insufficient documentation

## 2016-05-06 DIAGNOSIS — I6523 Occlusion and stenosis of bilateral carotid arteries: Secondary | ICD-10-CM | POA: Diagnosis not present

## 2016-05-06 DIAGNOSIS — K5730 Diverticulosis of large intestine without perforation or abscess without bleeding: Secondary | ICD-10-CM | POA: Insufficient documentation

## 2016-05-06 DIAGNOSIS — G9389 Other specified disorders of brain: Secondary | ICD-10-CM | POA: Diagnosis not present

## 2016-05-06 DIAGNOSIS — J449 Chronic obstructive pulmonary disease, unspecified: Secondary | ICD-10-CM | POA: Insufficient documentation

## 2016-05-06 DIAGNOSIS — F17200 Nicotine dependence, unspecified, uncomplicated: Secondary | ICD-10-CM | POA: Insufficient documentation

## 2016-05-06 DIAGNOSIS — I639 Cerebral infarction, unspecified: Secondary | ICD-10-CM

## 2016-05-06 DIAGNOSIS — E785 Hyperlipidemia, unspecified: Secondary | ICD-10-CM | POA: Diagnosis not present

## 2016-05-06 DIAGNOSIS — E871 Hypo-osmolality and hyponatremia: Secondary | ICD-10-CM | POA: Diagnosis not present

## 2016-05-06 DIAGNOSIS — F0390 Unspecified dementia without behavioral disturbance: Secondary | ICD-10-CM | POA: Insufficient documentation

## 2016-05-06 DIAGNOSIS — R112 Nausea with vomiting, unspecified: Secondary | ICD-10-CM | POA: Diagnosis not present

## 2016-05-06 DIAGNOSIS — R4701 Aphasia: Secondary | ICD-10-CM | POA: Diagnosis not present

## 2016-05-06 DIAGNOSIS — R197 Diarrhea, unspecified: Secondary | ICD-10-CM

## 2016-05-06 DIAGNOSIS — N201 Calculus of ureter: Secondary | ICD-10-CM

## 2016-05-06 DIAGNOSIS — N289 Disorder of kidney and ureter, unspecified: Secondary | ICD-10-CM

## 2016-05-06 DIAGNOSIS — Z96643 Presence of artificial hip joint, bilateral: Secondary | ICD-10-CM | POA: Diagnosis not present

## 2016-05-06 DIAGNOSIS — Z716 Tobacco abuse counseling: Secondary | ICD-10-CM

## 2016-05-06 DIAGNOSIS — F319 Bipolar disorder, unspecified: Secondary | ICD-10-CM | POA: Insufficient documentation

## 2016-05-06 DIAGNOSIS — R410 Disorientation, unspecified: Secondary | ICD-10-CM | POA: Diagnosis not present

## 2016-05-06 DIAGNOSIS — G912 (Idiopathic) normal pressure hydrocephalus: Secondary | ICD-10-CM | POA: Diagnosis not present

## 2016-05-06 DIAGNOSIS — E876 Hypokalemia: Secondary | ICD-10-CM | POA: Diagnosis not present

## 2016-05-06 DIAGNOSIS — N202 Calculus of kidney with calculus of ureter: Secondary | ICD-10-CM | POA: Diagnosis not present

## 2016-05-06 DIAGNOSIS — R1030 Lower abdominal pain, unspecified: Secondary | ICD-10-CM | POA: Diagnosis present

## 2016-05-06 DIAGNOSIS — K8020 Calculus of gallbladder without cholecystitis without obstruction: Secondary | ICD-10-CM | POA: Diagnosis not present

## 2016-05-06 DIAGNOSIS — I10 Essential (primary) hypertension: Secondary | ICD-10-CM

## 2016-05-06 DIAGNOSIS — N200 Calculus of kidney: Secondary | ICD-10-CM

## 2016-05-06 DIAGNOSIS — R4182 Altered mental status, unspecified: Secondary | ICD-10-CM

## 2016-05-06 HISTORY — DX: Chronic obstructive pulmonary disease, unspecified: J44.9

## 2016-05-06 HISTORY — DX: Essential (primary) hypertension: I10

## 2016-05-06 LAB — CBC
HEMATOCRIT: 42.6 % (ref 40.0–52.0)
Hemoglobin: 14.3 g/dL (ref 13.0–18.0)
MCH: 29.1 pg (ref 26.0–34.0)
MCHC: 33.7 g/dL (ref 32.0–36.0)
MCV: 86.5 fL (ref 80.0–100.0)
PLATELETS: 302 10*3/uL (ref 150–440)
RBC: 4.93 MIL/uL (ref 4.40–5.90)
RDW: 14.4 % (ref 11.5–14.5)
WBC: 8 10*3/uL (ref 3.8–10.6)

## 2016-05-06 LAB — URINE DRUG SCREEN, QUALITATIVE (ARMC ONLY)
Amphetamines, Ur Screen: NOT DETECTED
BARBITURATES, UR SCREEN: NOT DETECTED
BENZODIAZEPINE, UR SCRN: NOT DETECTED
CANNABINOID 50 NG, UR ~~LOC~~: NOT DETECTED
COCAINE METABOLITE, UR ~~LOC~~: NOT DETECTED
MDMA (Ecstasy)Ur Screen: NOT DETECTED
Methadone Scn, Ur: NOT DETECTED
OPIATE, UR SCREEN: NOT DETECTED
Phencyclidine (PCP) Ur S: NOT DETECTED
Tricyclic, Ur Screen: NOT DETECTED

## 2016-05-06 LAB — COMPREHENSIVE METABOLIC PANEL
ALK PHOS: 61 U/L (ref 38–126)
ALT: 11 U/L — ABNORMAL LOW (ref 17–63)
AST: 18 U/L (ref 15–41)
Albumin: 3.9 g/dL (ref 3.5–5.0)
Anion gap: 8 (ref 5–15)
BILIRUBIN TOTAL: 0.3 mg/dL (ref 0.3–1.2)
BUN: 18 mg/dL (ref 6–20)
CALCIUM: 8.4 mg/dL — AB (ref 8.9–10.3)
CO2: 24 mmol/L (ref 22–32)
Chloride: 104 mmol/L (ref 101–111)
Creatinine, Ser: 1.39 mg/dL — ABNORMAL HIGH (ref 0.61–1.24)
GFR calc Af Amer: 56 mL/min — ABNORMAL LOW (ref >60)
GFR, EST NON AFRICAN AMERICAN: 48 mL/min — AB (ref >60)
Glucose, Bld: 91 mg/dL (ref 65–99)
POTASSIUM: 3.5 mmol/L (ref 3.5–5.1)
Sodium: 136 mmol/L (ref 135–145)
TOTAL PROTEIN: 7.5 g/dL (ref 6.5–8.1)

## 2016-05-06 LAB — URINALYSIS COMPLETE WITH MICROSCOPIC (ARMC ONLY)
BILIRUBIN URINE: NEGATIVE
GLUCOSE, UA: NEGATIVE mg/dL
HGB URINE DIPSTICK: NEGATIVE
LEUKOCYTES UA: NEGATIVE
NITRITE: NEGATIVE
PH: 6 (ref 5.0–8.0)
PROTEIN: 30 mg/dL — AB
Specific Gravity, Urine: 1.016 (ref 1.005–1.030)
Squamous Epithelial / LPF: NONE SEEN

## 2016-05-06 LAB — AMMONIA: Ammonia: 19 umol/L (ref 9–35)

## 2016-05-06 LAB — LIPASE, BLOOD: Lipase: 34 U/L (ref 11–51)

## 2016-05-06 LAB — ETHANOL: Alcohol, Ethyl (B): <5 mg/dL (ref <5)

## 2016-05-06 LAB — TROPONIN I: Troponin I: <0.03 ng/mL (ref <0.03)

## 2016-05-06 NOTE — ED Provider Notes (Signed)
[facility] Emergency Department Provider Note  ____________________________________________  Time seen: Approximately 8:14 PM  I have reviewed the triage vital signs and the nursing notes.   HISTORY  Chief Complaint Abdominal Pain and Diarrhea  The history is limited due to the patient altered mental status, and his daughter is here to help elicit the history.  HPI Jonathan Howell is a 75 y.o. male with a history of hypertension, COPD, bipolar d/o presenting with altered mental status, diarrhea. The patient reports 2 days of lower abdominal pain with several episodes of diarrhea after eating "weenies with beenies" yesterday and 5 episodes of vomiting today. He denies any fever, chills, dysuria. His daughter is more concerned that today he was confused. Normally he is alert and oriented 3 and is able to answer questions appropriately, but throughout the day he has become more and more confused. Drugs and alcohol are denied. No known trauma. The patient has not anticoagulated.   Past Medical History:  Diagnosis Date  . COPD (chronic obstructive pulmonary disease) ([place])   . Hypertension   . Personal history of tobacco use, presenting hazards to health 07/14/2015    Patient Active Problem List   Diagnosis Date Noted  . Personal history of tobacco use, presenting hazards to health 07/14/2015    History reviewed. No pertinent surgical history.    Allergies Review of patient's allergies indicates no known allergies.  History reviewed. No pertinent family history.  Social History Social History  Substance Use Topics  . Smoking status: Heavy Tobacco Smoker  . Smokeless tobacco: Never Used  . Alcohol use No    Review of Systems Constitutional: No fever/chills.No lightheadedness or syncope. Positive altered mental status. Eyes: No visual changes. No blurred or double vision. ENT: No sore throat. No congestion or rhinorrhea. Cardiovascular: Denies chest  pain. Denies palpitations. Respiratory: Denies shortness of breath.  No cough. Gastrointestinal: Positive bilateral lower abdominal pain.  Positive nausea, positive vomiting.  Positive diarrhea.  No constipation. Genitourinary: Negative for dysuria. Musculoskeletal: Negative for back pain. Skin: Negative for rash. Neurological: Negative for headaches. No focal numbness, tingling or weakness. No difficulty walking.  10-point ROS otherwise negative.  ____________________________________________   PHYSICAL EXAM:  VITAL SIGNS: ED Triage Vitals  Enc Vitals Group     BP 05/06/16 1919 (!) 122/92     Pulse Rate 05/06/16 1919 100     Resp 05/06/16 1919 18     Temp 05/06/16 1919 99.3 F (37.4 C)     Temp Source 05/06/16 1919 Oral     SpO2 05/06/16 1919 96 %     Weight 05/06/16 1919 174 lb (78.9 kg)     Height 05/06/16 1919 '5\' 2"'$  (1.575 m)     Head Circumference --      Peak Flow --      Pain Score 05/06/16 1923 10     Pain Loc --      Pain Edu? --      Excl. in [place]? --     Constitutional: Patient is alert and able to answer questions although he does not always answer them appropriately. His GCS is 15 and he is protecting his airway. He is not uncomfortable appearing or toxic in appearance. Eyes: Conjunctivae are normal.  EOMI. PERRLA. No scleral icterus. Head: Atraumatic. No raccoon eyes or Battle's sign. Nose: No congestion/rhinnorhea. Mouth/Throat: Mucous membranes are moist.  Neck: No stridor.  Supple.  No JVD. No meningismus. Cardiovascular: Normal rate, regular rhythm. No murmurs, rubs or gallops.  Respiratory: Normal respiratory effort.  No accessory muscle use or retractions. Lungs CTAB.  No wheezes, rales or ronchi. Gastrointestinal: Soft, nontender and nondistended. No reproducible tenderness to palpation. No guarding or rebound.  No peritoneal signs. Musculoskeletal: No LE edema. No ttp in the calves or palpable cords.  Negative Homan's sign. Neurologic: Alert and  oriented to person but states the year is 1348, he does not know the month or the day of the week. He thinks the president of [place] is Freight forwarder.Marland Kitchen Speech is mostly clear although occasionally has an expressive aphasia.. Naming is not completely intact. Face and smile symmetric. Tongue is midline.  No pronator drift. 5 out of 5 grip, biceps, triceps, hip flexors, plantar flexion and dorsiflexion. Normal sensation to light touch in the bilateral upper and lower extremities, and face. Patient appears to have ataxia with right handed finger-nose-finger, left hand finger-nose-finger is normal. Skin:  Skin is warm, dry and intact. No rash noted. Psychiatric: Mood and affect are normal. Speech and behavior are normal.  Normal judgement.  ____________________________________________   LABS (all labs ordered are listed, but only abnormal results are displayed)  Labs Reviewed  COMPREHENSIVE METABOLIC PANEL - Abnormal; Notable for the following:       Result Value   Creatinine, Ser 1.39 (*)    Calcium 8.4 (*)    ALT 11 (*)    GFR calc non Af Amer 48 (*)    GFR calc Af Amer 56 (*)    All other components within normal limits  URINALYSIS COMPLETEWITH MICROSCOPIC ([facility] ONLY) - Abnormal; Notable for the following:    Color, Urine YELLOW (*)    APPearance CLEAR (*)    Ketones, ur TRACE (*)    Protein, ur 30 (*)    Bacteria, UA RARE (*)    All other components within normal limits  LIPASE, BLOOD  CBC  AMMONIA  URINE DRUG SCREEN, QUALITATIVE ([facility] ONLY)  ETHANOL  TROPONIN I   ____________________________________________  EKG  ED ECG REPORT I, Eula Listen, the attending physician, personally viewed and interpreted this ECG.   Date: 05/06/2016  EKG Time: 2007  Rate: 91  Rhythm: normal sinus rhythm  Axis: normal  Intervals:none  ST&T Change: No STEMI  ____________________________________________  RADIOLOGY  Dg Chest 2 View  Result Date: 05/06/2016 CLINICAL DATA:  Per pt  family, pt has been having cp, abdominal pain, weakness and AMS today. Pt hx of asthma, COPD, emphysema per family members. Pt was a smoker. EXAM: CHEST  2 VIEW COMPARISON:  Chest CT, 02/28/2015.  Chest radiograph, 01/30/2015. FINDINGS: Cardiac silhouette is normal in size and configuration. The aorta is mildly uncoiled. No mediastinal or hilar masses or convincing adenopathy. Lungs are hyperexpanded. Irregular interstitial thickening in the lung bases consistent with fibrosis is stable. There are advanced changes of emphysema, also stable. No evidence of pneumonia or pulmonary edema. No pleural effusion or pneumothorax. Bony thorax is demineralized. There arthropathic changes of both shoulders, stable. IMPRESSION: 1. No acute cardiopulmonary disease. 2. 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 Electronically Signed   By: Lajean Manes M.D.   On: 05/06/2016 20:38   ____________________________________________   PROCEDURES  Procedure(s) performed: None  Procedures  Critical Care performed: No ____________________________________________   INITIAL IMPRESSION / ASSESSMENT AND PLAN / ED COURSE  Pertinent labs & imaging results that were available during my care of the patient were reviewed by me and considered in my medical decision making (see chart for details).  75 y.o. male with a history of COPD, HTN, bipolar disorder presenting with altered mental status. The patient also reports abdominal pain with diarrhea, nausea and vomiting, so a get a CT scan to evaluate for intra-abdominal pathology although it appears that his altered mental status and speech changes are more prominent chief complaints. I am concerned that the patient may have an acute stroke, although he is not a candidate for TPA so no code stroke is called at this time. In addition, his mental status changes may be due to abnormal liver function or elevated ammonia, although there is no known drinking  history.  I will also get drug screens to evaluate for any abnormality in his urine, especially given his history of bipolar disorder. We will get a CT scan of the head to rule out any intracranial abnormalities including stroke or bleeding injury, mass.  We'll get basic labs to rule out July abnormalities as well as UTI. I anticipate admission to the hospital.  ----------------------------------------- 12:06 AM on 05/07/2016 -----------------------------------------  The patient has remained hemodynamically stable throughout his ED stay. His laboratory studies are reassuring with a normal ammonia, normal electrolytes, no drug in the urine, no UTI, negative troponin. I'm awaiting the results of his CT abdomen and CT head. The patient is been signed out to Dr. Joni Fears who will follow-up the imaging results for final disposition.  ____________________________________________  FINAL CLINICAL IMPRESSION(S) / ED DIAGNOSES  Final diagnoses:  Lower abdominal pain  Altered mental status, unspecified altered mental status type  Expressive aphasia  Renal insufficiency    Clinical Course      NEW MEDICATIONS STARTED DURING THIS VISIT:  New Prescriptions   No medications on file      Eula Listen, MD 05/07/16 [id number]

## 2016-05-06 NOTE — ED Notes (Signed)
Patient transported to CT 

## 2016-05-07 ENCOUNTER — Inpatient Hospital Stay: Payer: Medicare Other

## 2016-05-07 DIAGNOSIS — R4182 Altered mental status, unspecified: Secondary | ICD-10-CM | POA: Diagnosis not present

## 2016-05-07 DIAGNOSIS — R112 Nausea with vomiting, unspecified: Secondary | ICD-10-CM | POA: Diagnosis not present

## 2016-05-07 DIAGNOSIS — N201 Calculus of ureter: Secondary | ICD-10-CM | POA: Diagnosis not present

## 2016-05-07 DIAGNOSIS — N289 Disorder of kidney and ureter, unspecified: Secondary | ICD-10-CM

## 2016-05-07 DIAGNOSIS — R1084 Generalized abdominal pain: Secondary | ICD-10-CM | POA: Diagnosis not present

## 2016-05-07 LAB — CBC
HCT: 42.4 % (ref 40.0–52.0)
HEMOGLOBIN: 14.6 g/dL (ref 13.0–18.0)
MCH: 29.7 pg (ref 26.0–34.0)
MCHC: 34.3 g/dL (ref 32.0–36.0)
MCV: 86.4 fL (ref 80.0–100.0)
PLATELETS: 304 10*3/uL (ref 150–440)
RBC: 4.91 MIL/uL (ref 4.40–5.90)
RDW: 14.6 % — ABNORMAL HIGH (ref 11.5–14.5)
WBC: 9.7 10*3/uL (ref 3.8–10.6)

## 2016-05-07 LAB — COMPREHENSIVE METABOLIC PANEL
ALK PHOS: 58 U/L (ref 38–126)
ALT: 10 U/L — AB (ref 17–63)
ANION GAP: 11 (ref 5–15)
AST: 16 U/L (ref 15–41)
Albumin: 3.7 g/dL (ref 3.5–5.0)
BUN: 21 mg/dL — ABNORMAL HIGH (ref 6–20)
CALCIUM: 8.6 mg/dL — AB (ref 8.9–10.3)
CO2: 22 mmol/L (ref 22–32)
CREATININE: 1.26 mg/dL — AB (ref 0.61–1.24)
Chloride: 102 mmol/L (ref 101–111)
GFR, EST NON AFRICAN AMERICAN: 54 mL/min — AB (ref >60)
Glucose, Bld: 121 mg/dL — ABNORMAL HIGH (ref 65–99)
Potassium: 2.9 mmol/L — ABNORMAL LOW (ref 3.5–5.1)
SODIUM: 135 mmol/L (ref 135–145)
Total Bilirubin: 1 mg/dL (ref 0.3–1.2)
Total Protein: 7.3 g/dL (ref 6.5–8.1)

## 2016-05-07 LAB — TROPONIN I: Troponin I: <0.03 ng/mL (ref <0.03)

## 2016-05-07 LAB — TSH: TSH: 1.162 u[IU]/mL (ref 0.350–4.500)

## 2016-05-07 LAB — OCCULT BLOOD X 1 CARD TO LAB, STOOL: FECAL OCCULT BLD: NEGATIVE

## 2016-05-07 LAB — HEMOGLOBIN A1C: HEMOGLOBIN A1C: 5.5 % (ref 4.0–6.0)

## 2016-05-07 LAB — VITAMIN B12: VITAMIN B 12: 197 pg/mL (ref 180–914)

## 2016-05-07 LAB — FOLATE: Folate: 6.6 ng/mL (ref >5.9)

## 2016-05-07 LAB — POTASSIUM: POTASSIUM: 4.3 mmol/L (ref 3.5–5.1)

## 2016-05-07 LAB — SEDIMENTATION RATE: SED RATE: 29 mm/h — AB (ref 0–20)

## 2016-05-07 LAB — MAGNESIUM: MAGNESIUM: 1.9 mg/dL (ref 1.7–2.4)

## 2016-05-07 MED ORDER — NICOTINE 7 MG/24HR TD PT24
7.0000 mg | MEDICATED_PATCH | Freq: Every day | TRANSDERMAL | Status: DC
  Administered 2016-05-07 – 2016-05-09 (×3): 7 mg via TRANSDERMAL
  Filled 2016-05-07 (×4): qty 1

## 2016-05-07 MED ORDER — POTASSIUM CHLORIDE CRYS ER 20 MEQ PO TBCR
40.0000 meq | EXTENDED_RELEASE_TABLET | ORAL | Status: AC
Start: 2016-05-07 — End: 2016-05-07
  Administered 2016-05-07 (×3): 40 meq via ORAL
  Filled 2016-05-07 (×3): qty 2

## 2016-05-07 MED ORDER — ACETAMINOPHEN 325 MG PO TABS: 650.0000 mg | ORAL_TABLET | Freq: Four times a day (QID) | ORAL | Status: DC | PRN

## 2016-05-07 MED ORDER — SODIUM CHLORIDE 0.9 % IV SOLN: 250.0000 mL | INTRAVENOUS | Status: DC | PRN

## 2016-05-07 MED ORDER — SODIUM CHLORIDE 0.9% FLUSH
3.0000 mL | INTRAVENOUS | Status: DC | PRN
Start: 2016-05-07 — End: 2016-05-09

## 2016-05-07 MED ORDER — ACETAMINOPHEN 650 MG RE SUPP: 650.0000 mg | Freq: Four times a day (QID) | RECTAL | Status: DC | PRN

## 2016-05-07 MED ORDER — ASPIRIN EC 81 MG PO TBEC
81.0000 mg | DELAYED_RELEASE_TABLET | Freq: Every day | ORAL | Status: DC
  Administered 2016-05-07 – 2016-05-09 (×3): 81 mg via ORAL
  Filled 2016-05-07 (×3): qty 1

## 2016-05-07 MED ORDER — ENOXAPARIN SODIUM 40 MG/0.4ML ~~LOC~~ SOLN
40.0000 mg | SUBCUTANEOUS | Status: DC
  Administered 2016-05-07 – 2016-05-08 (×2): 40 mg via SUBCUTANEOUS
  Filled 2016-05-07 (×2): qty 0.4

## 2016-05-07 MED ORDER — METOPROLOL TARTRATE 5 MG/5ML IV SOLN
5.0000 mg | Freq: Once | INTRAVENOUS | Status: AC
  Administered 2016-05-07: 5 mg via INTRAVENOUS
  Filled 2016-05-07: qty 5

## 2016-05-07 MED ORDER — ONDANSETRON HCL 4 MG/2ML IJ SOLN: 4.0000 mg | Freq: Four times a day (QID) | INTRAMUSCULAR | Status: DC | PRN

## 2016-05-07 MED ORDER — POTASSIUM CHLORIDE IN NACL 20-0.9 MEQ/L-% IV SOLN
INTRAVENOUS | Status: DC
  Administered 2016-05-07 – 2016-05-08 (×2): via INTRAVENOUS
  Filled 2016-05-07 (×3): qty 1000

## 2016-05-07 MED ORDER — AMLODIPINE BESYLATE 5 MG PO TABS
2.5000 mg | ORAL_TABLET | Freq: Every day | ORAL | Status: DC
  Administered 2016-05-07: 2.5 mg via ORAL
  Filled 2016-05-07: qty 1

## 2016-05-07 MED ORDER — SODIUM CHLORIDE 0.9% FLUSH: 3.0000 mL | Freq: Two times a day (BID) | INTRAVENOUS | Status: DC

## 2016-05-07 MED ORDER — SODIUM CHLORIDE 0.9 % IV SOLN
INTRAVENOUS | Status: DC
  Administered 2016-05-07: 06:00:00 via INTRAVENOUS

## 2016-05-07 MED ORDER — HYDRALAZINE HCL 20 MG/ML IJ SOLN
10.0000 mg | INTRAMUSCULAR | Status: DC | PRN
  Administered 2016-05-07 – 2016-05-08 (×2): 10 mg via INTRAVENOUS
  Filled 2016-05-07 (×2): qty 1

## 2016-05-07 MED ORDER — QUETIAPINE FUMARATE 200 MG PO TABS
400.0000 mg | ORAL_TABLET | Freq: Every day | ORAL | Status: DC
  Administered 2016-05-07 – 2016-05-08 (×2): 400 mg via ORAL
  Filled 2016-05-07 (×2): qty 2

## 2016-05-07 MED ORDER — ONDANSETRON HCL 4 MG PO TABS: 4.0000 mg | ORAL_TABLET | Freq: Four times a day (QID) | ORAL | Status: DC | PRN

## 2016-05-07 MED ORDER — FLUOXETINE HCL 20 MG PO CAPS
40.0000 mg | ORAL_CAPSULE | Freq: Every day | ORAL | Status: DC
  Administered 2016-05-07 – 2016-05-09 (×3): 40 mg via ORAL
  Filled 2016-05-07 (×3): qty 2

## 2016-05-07 MED ORDER — SODIUM CHLORIDE 0.9% FLUSH
3.0000 mL | Freq: Two times a day (BID) | INTRAVENOUS | Status: DC
  Administered 2016-05-08 (×2): 3 mL via INTRAVENOUS

## 2016-05-07 NOTE — Progress Notes (Addendum)
[facility] [place] NAME: Jonathan Howell    MR#:  [id number]  DATE OF BIRTH:  06-Nov-1940  SUBJECTIVE:  CHIEF COMPLAINT:   Chief Complaint  Patient presents with  . Abdominal Pain  . Diarrhea  Patient is 75 year old African-American male with past medical history significant for history of essential hypertension, COPD, bipolar disorder, who presents with altered mental status and diarrhea, nausea. He still complains of nausea and diarrheal stool, remains confused. Patient's diastolic blood pressure was noted to be 106 last night on IV fluids. Patient denies any pain.  Review of Systems  Unable to perform ROS: Mental status change  Constitutional: Negative for chills, fever and weight loss.  HENT: Negative for congestion.   Eyes: Negative for blurred vision and double vision.  Respiratory: Negative for sputum production.   Cardiovascular: Negative for palpitations, orthopnea, leg swelling and PND.  Gastrointestinal: Negative for abdominal pain, blood in stool, constipation, diarrhea, nausea and vomiting.  Genitourinary: Negative for dysuria, frequency, hematuria and urgency.  Musculoskeletal: Negative for falls.  Neurological: Negative for dizziness, tremors, focal weakness and headaches.  Endo/Heme/Allergies: Does not bruise/bleed easily.  Psychiatric/Behavioral: Negative for depression. The patient does not have insomnia.     VITAL SIGNS: Blood pressure 113/68, pulse 92, temperature 98.6 F (37 C), temperature source Oral, resp. rate 18, height '5\' 2"'$  (1.575 m), weight 65.3 kg (144 lb), SpO2 94 %.  PHYSICAL EXAMINATION:   GENERAL:  75 y.o.-year-old patient lying in the bed with no acute distress. Comfortable sitting on the bed EYES: Pupils equal, round, reactive to light and accommodation. No scleral icterus. Extraocular muscles intact.  HEENT: Head atraumatic, normocephalic. Oropharynx and nasopharynx clear.  NECK:  Supple, no  jugular venous distention. No thyroid enlargement, no tenderness.  LUNGS: Normal breath sounds bilaterally, no wheezing, rales,rhonchi or crepitation. No use of accessory muscles of respiration.  CARDIOVASCULAR: S1, S2 normal. No murmurs, rubs, or gallops.  ABDOMEN: Soft, nontender, nondistended. Bowel sounds present. No organomegaly or mass.  EXTREMITIES: No pedal edema, cyanosis, or clubbing.  NEUROLOGIC: Cranial nerves II through XII are intact. Muscle strength 5/5 in all extremities. Sensation intact. Gait not checked.  PSYCHIATRIC: The patient is alert and oriented x 1, confused, answering questions, answers yes to all questions.  SKIN: No obvious rash, lesion, or ulcer.   ORDERS/RESULTS REVIEWED:   CBC  Recent Labs Lab 05/06/16 1927 05/07/16 0608  WBC 8.0 9.7  HGB 14.3 14.6  HCT 42.6 42.4  PLT 302 304  MCV 86.5 86.4  MCH 29.1 29.7  MCHC 33.7 34.3  RDW 14.4 14.6*   ------------------------------------------------------------------------------------------------------------------  Chemistries   Recent Labs Lab 05/06/16 1927 05/07/16 0608  NA 136 135  K 3.5 2.9*  CL 104 102  CO2 24 22  GLUCOSE 91 121*  BUN 18 21*  CREATININE 1.39* 1.26*  CALCIUM 8.4* 8.6*  AST 18 16  ALT 11* 10*  ALKPHOS 61 58  BILITOT 0.3 1.0   ------------------------------------------------------------------------------------------------------------------ estimated creatinine clearance is 39.7 mL/min (by C-G formula based on SCr of 1.26 mg/dL). ------------------------------------------------------------------------------------------------------------------  Recent Labs  05/07/16 0608  TSH 1.162    Cardiac Enzymes  Recent Labs Lab 05/06/16 1927 05/07/16 0608  TROPONINI <0.03 <0.03   ------------------------------------------------------------------------------------------------------------------ Invalid input(s):  POCBNP ---------------------------------------------------------------------------------------------------------------  RADIOLOGY: Dg Chest 2 View  Result Date: 05/06/2016 CLINICAL DATA:  Per pt family, pt has been having cp, abdominal pain, weakness and AMS today. Pt hx of asthma, COPD, emphysema per family members. Pt  was a smoker. EXAM: CHEST  2 VIEW COMPARISON:  Chest CT, 02/28/2015.  Chest radiograph, 01/30/2015. FINDINGS: Cardiac silhouette is normal in size and configuration. The aorta is mildly uncoiled. No mediastinal or hilar masses or convincing adenopathy. Lungs are hyperexpanded. Irregular interstitial thickening in the lung bases consistent with fibrosis is stable. There are advanced changes of emphysema, also stable. No evidence of pneumonia or pulmonary edema. No pleural effusion or pneumothorax. Bony thorax is demineralized. There arthropathic changes of both shoulders, stable. IMPRESSION: 1. No acute cardiopulmonary disease. 2. 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 Electronically Signed   By: Lajean Manes M.D.   On: 05/06/2016 20:38  Ct Head Wo Contrast  Result Date: 05/07/2016 CLINICAL DATA:  75 year old male with lightheadedness and vomiting EXAM: CT HEAD WITHOUT CONTRAST TECHNIQUE: Contiguous axial images were obtained from the base of the skull through the vertex without intravenous contrast. COMPARISON:  None. FINDINGS: There is mild dilatation of the ventricles out of proportion with the sulci which may represent central volume loss versus normal pressure hydrocephalus. Clinical correlation is recommended. Check mild periventricular and deep white matter chronic microvascular ischemic changes noted. There is no acute intracranial hemorrhage. No mass effect or midline shift noted. There is opacification of the right frontal sinus and multiple right ethmoid air cells. No air-fluid levels. The mastoid air cells are clear. The calvarium is intact.  IMPRESSION: No acute intracranial hemorrhage. Mild age-related atrophy and chronic microvascular ischemic disease. If symptoms persist and there are no contraindications, MRI may provide better evaluation if clinically indicated Electronically Signed   By: Anner Crete M.D.   On: 05/07/2016 00:19  Ct Renal Stone Study  Result Date: 05/07/2016 CLINICAL DATA:  75 year old male with lightheadedness and vomiting EXAM: CT ABDOMEN AND PELVIS WITHOUT CONTRAST TECHNIQUE: Multidetector CT imaging of the abdomen and pelvis was performed following the standard protocol without IV contrast. COMPARISON:  Chest CT dated 08/17/2014 FINDINGS: Evaluation of this exam is limited in the absence of intravenous contrast. There are emphysematous changes of the visualized lung bases. A 7 mm subpleural nodule in the left lower low lobe (series 2 image 8) appears similar to study dated 08/17/2014. There is calcification of the mitral annulus. No intra-abdominal free air or free fluid. There is mild eventration of the right hemidiaphragm. There multiple stones within the gallbladder. No pericholecystic fluid or evidence of inflammation on CT. The liver, pancreas, spleen, adrenal glands appear unremarkable. There is a 10 mm stone in the left renal pelvis at the left ureteropelvic junction. There is no hydronephrosis. The right kidney appears unremarkable. An ill-defined subcentimeter hypodense lesion may be present in the upper superior pole of the right kidney. Ultrasound is recommended for further evaluation. The visualized ureters and urinary bladder appear unremarkable. The prostate and seminal vesicles are not well evaluated due to streak artifact caused by metallic hip arthroplasties. There is no evidence of bowel obstruction or active inflammation. There scattered colonic diverticula without active inflammatory changes. Normal appendix. There is advanced aortoiliac atherosclerotic disease. There is a 2.7 cm infrarenal aortic  ectasia. There is atherosclerotic calcification of the ostia of the renal arteries. No portal venous gas identified. There is no adenopathy. The abdominal wall soft tissues appear unremarkable. There is degenerative changes of the spine with multilevel disc desiccation and vacuum phenomena with multilevel endplate irregularity noted. No acute fracture. IMPRESSION: A 1 cm left UPJ stone.  No hydronephrosis. Cholelithiasis. Colonic diverticulosis. No evidence of bowel obstruction or active  inflammation. Normal appendix. Electronically Signed   By: Anner Crete M.D.   On: 05/07/2016 00:26   EKG:  Orders placed or performed during the hospital encounter of 05/06/16  . ED EKG  . ED EKG  . EKG 12-Lead  . EKG 12-Lead  . EKG 12-Lead  . EKG 12-Lead    ASSESSMENT AND PLAN:  Active Problems:   Altered mental status #1. Diarrhea, unclear etiology, get stool cultures, including C. difficile, contact precautions, IV fluids #2. Hypokalemia, get magnesium level checked, supplement potassium orally and intravenously, followed later today #3. Renal insufficiency, chronic, no significant improvement with IV fluid administration, get bladder scan #4 . Altered mental status, confusion in patient with history of bipolar disorder, now resumed Seroquel, following clinically #5. Essential hypertension, decrease IV fluid rate, start Norvasc, advance it as needed #6. Tobacco abuse. Counseling, discussed this patient for approximately 3 minutes, nicotine replacement therapy is going to be initiated, agreeable  Management plans discussed with the patient, family and they are in agreement.   DRUG ALLERGIES: No Known Allergies  CODE STATUS:     Code Status Orders        Start     Ordered   05/07/16 0522  Full code  Continuous     05/07/16 0521    Code Status History    Date Active Date Inactive Code Status Order ID Comments User Context   This patient has a current code status but no historical code  status.      TOTAL TIME TAKING CARE OF THIS PATIENT: 40 minutes.    Theodoro Grist M.D on 05/07/2016 at 10:53 AM  Between 7am to 6pm - Pager - [phone]  After 6pm go to [email or web address] - password [email or web address] [facility]  Office  [phone]  CC: Primary care physician; Petra Kuba, MD

## 2016-05-07 NOTE — Care Management (Signed)
TC to patient's daughter, Doreene Nest. Patient lives at home with his daughter. He requires and uses no DME. No home O2 or home health. He is able to provide his own adls but daughter assists with transportation. Denies issues obtaining medications, copays or medical care. PCP is Dr. Perry Mount per daughter.

## 2016-05-07 NOTE — Consult Note (Signed)
Reason for Consult: Altered Mental Status Referring Physician: Ether Griffins  CC: Confusion  HPI: Jonathan Howell is an 75 y.o. male who due to his AMS is unable to provide any history today.  No family available.  All history obtained from the chart.  The patient initially reported 2 days of lower abdominal pain with several episodes of diarrhea after eating "weenies with beenies" on Sunday and 5 episodes of vomiting yesterday. Daughter was also concerned that the patient was confused. Normally he is alert and oriented 3and is able to answer questions appropriately, but throughout the day yesterday he became more and more confused.  She does report that he usually takes Seroquel which he hasn't taken in several nights because he ran out.  He has been sleep deprived over the past few days.  Past Medical History:  Diagnosis Date  . COPD (chronic obstructive pulmonary disease) ([place])   . Hypertension   . Personal history of tobacco use, presenting hazards to health 07/14/2015    History reviewed. No pertinent surgical history.  Family history: Unable to provide due to mental status  Social History:  reports that he has been smoking.  He has never used smokeless tobacco. He reports that he does not drink alcohol or use drugs.  No Known Allergies  Medications:  I have reviewed the patient's current medications. Prior to Admission:  Prescriptions Prior to Admission  Medication Sig Dispense Refill Last Dose  . FLUoxetine (PROZAC) 20 MG tablet Take 40 mg by mouth daily.   Past Week at Unknown time  . QUEtiapine (SEROQUEL) 400 MG tablet Take 400 mg by mouth at bedtime.   Past Week at Unknown time   Scheduled: . aspirin EC  81 mg Oral Daily  . enoxaparin (LOVENOX) injection  40 mg Subcutaneous Q24H  . FLUoxetine  40 mg Oral Daily  . potassium chloride  40 mEq Oral Q4H  . QUEtiapine  400 mg Oral QHS  . sodium chloride flush  3 mL Intravenous Q12H  . sodium chloride flush  3 mL Intravenous Q12H     ROS: History obtained from the patient  General ROS: negative for - chills, fatigue, fever, night sweats, weight gain or weight loss Psychological ROS: negative for - behavioral disorder, hallucinations, memory difficulties, mood swings or suicidal ideation Ophthalmic ROS: negative for - blurry vision, double vision, eye pain or loss of vision ENT ROS: negative for - epistaxis, nasal discharge, oral lesions, sore throat, tinnitus or vertigo Allergy and Immunology ROS: negative for - hives or itchy/watery eyes Hematological and Lymphatic ROS: negative for - bleeding problems, bruising or swollen lymph nodes Endocrine ROS: negative for - galactorrhea, hair pattern changes, polydipsia/polyuria or temperature intolerance Respiratory ROS: negative for - cough, hemoptysis, shortness of breath or wheezing Cardiovascular ROS: negative for - chest pain, dyspnea on exertion, edema or irregular heartbeat Gastrointestinal ROS: as noted in HPI Genito-Urinary ROS: negative for - dysuria, hematuria, incontinence or urinary frequency/urgency Musculoskeletal ROS: negative for - joint swelling or muscular weakness Neurological ROS: as noted in HPI Dermatological ROS: negative for rash and skin lesion changes  Physical Examination: Blood pressure 113/68, pulse 92, temperature 98.6 F (37 C), temperature source Oral, resp. rate 18, height 5' 2"  (1.575 m), weight 65.3 kg (144 lb), SpO2 94 %.  HEENT-  Normocephalic, no lesions, without obvious abnormality.  Normal external eye and conjunctiva.  Normal TM's bilaterally.  Normal auditory canals and external ears. Normal external nose, mucus membranes and septum.  Normal pharynx. Cardiovascular- S1, S2  normal, pulses palpable throughout   Lungs- chest clear, no wheezing, rales, normal symmetric air entry Abdomen- soft, non-tender; bowel sounds normal; no masses,  no organomegaly Extremities- no edema Lymph-no adenopathy palpable Musculoskeletal-no joint  tenderness, deformity or swelling Skin-warm and dry, no hyperpigmentation, vitiligo, or suspicious lesions  Neurological Examination Mental Status: Alert.  Patient oriented to name, age and birth date.  Did know he was in the hospital but not which one.  Knew it was July but not the date or year.  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  Speech fluent without evidence of aphasia.  Able to follow 3 step commands without difficulty. Cranial Nerves: II: Discs flat bilaterally; Visual fields grossly normal, pupils equal, round, reactive to light and accommodation III,IV, VI: ptosis not present, extra-ocular motions intact bilaterally V,VII: smile symmetric, facial light touch sensation normal bilaterally VIII: hearing normal bilaterally IX,X: gag reflex present XI: bilateral shoulder shrug XII: midline tongue extension Motor: Right : Upper extremity   5/5    Left:     Upper extremity   5/5  Lower extremity   5/5     Lower extremity   5/5 Tone and bulk:normal tone throughout; no atrophy noted.  Tremulous. Sensory: Pinprick and light touch intact throughout, bilaterally Deep Tendon Reflexes: 2+ and symmetric throughout Plantars: Right: downgoing   Left: downgoing Cerebellar: Normal finger-to-nose and normal heel-to-shin testing bilaterally Gait: not tested due to safety concerns    Laboratory Studies:   Basic Metabolic Panel:  Recent Labs Lab 05/06/16 1927 05/07/16 0608  NA 136 135  K 3.5 2.9*  CL 104 102  CO2 24 22  GLUCOSE 91 121*  BUN 18 21*  CREATININE 1.39* 1.26*  CALCIUM 8.4* 8.6*    Liver Function Tests:  Recent Labs Lab 05/06/16 1927 05/07/16 0608  AST 18 16  ALT 11* 10*  ALKPHOS 61 58  BILITOT 0.3 1.0  PROT 7.5 7.3  ALBUMIN 3.9 3.7    Recent Labs Lab 05/06/16 1927  LIPASE 34    Recent Labs Lab 05/06/16 2041  AMMONIA 19    CBC:  Recent Labs Lab 05/06/16 1927 05/07/16 0608  WBC 8.0 9.7  HGB 14.3 14.6  HCT 42.6 42.4  MCV 86.5 86.4  PLT 302 304     Cardiac Enzymes:  Recent Labs Lab 05/06/16 1927 05/07/16 0608  TROPONINI <0.03 <0.03    BNP: Invalid input(s): POCBNP  CBG: No results for input(s): GLUCAP in the last 168 hours.  Microbiology: Results for orders placed or performed during the hospital encounter of 02/09/15  Urine culture     Status: None   Collection Time: 02/09/15  1:26 PM  Result Value Ref Range Status   Micro Text Report   Final       SOURCE: CLEAN CATCH    COMMENT                   MIXED BACTERIAL ORGANISMS   COMMENT                   RESULTS SUGGESTIVE OF CONTAMINATION   ANTIBIOTIC                                                        Coagulation Studies: No results for input(s): LABPROT, INR in the last 72 hours.  Urinalysis:  Recent Labs Lab 05/06/16 2040  COLORURINE YELLOW*  LABSPEC 1.016  PHURINE 6.0  GLUCOSEU NEGATIVE  HGBUR NEGATIVE  BILIRUBINUR NEGATIVE  KETONESUR TRACE*  PROTEINUR 30*  NITRITE NEGATIVE  LEUKOCYTESUR NEGATIVE    Lipid Panel:  No results found for: CHOL, TRIG, HDL, CHOLHDL, VLDL, LDLCALC  HgbA1C: No results found for: HGBA1C  Urine Drug Screen:     Component Value Date/Time   LABOPIA NONE DETECTED 05/06/2016 2041   COCAINSCRNUR NONE DETECTED 05/06/2016 2041   LABBENZ NONE DETECTED 05/06/2016 2041   AMPHETMU NONE DETECTED 05/06/2016 2041   THCU NONE DETECTED 05/06/2016 2041   LABBARB NONE DETECTED 05/06/2016 2041    Alcohol Level:  Recent Labs Lab 05/06/16 1927  ETH <5    Other results: EKG: normal sinus rhythm at 90 bpm.  Imaging: Dg Chest 2 View  Result Date: 05/06/2016 CLINICAL DATA:  Per pt family, pt has been having cp, abdominal pain, weakness and AMS today. Pt hx of asthma, COPD, emphysema per family members. Pt was a smoker. EXAM: CHEST  2 VIEW COMPARISON:  Chest CT, 02/28/2015.  Chest radiograph, 01/30/2015. FINDINGS: Cardiac silhouette is normal in size and configuration. The aorta is mildly uncoiled. No mediastinal or hilar  masses or convincing adenopathy. Lungs are hyperexpanded. Irregular interstitial thickening in the lung bases consistent with fibrosis is stable. There are advanced changes of emphysema, also stable. No evidence of pneumonia or pulmonary edema. No pleural effusion or pneumothorax. Bony thorax is demineralized. There arthropathic changes of both shoulders, stable. IMPRESSION: 1. No acute cardiopulmonary disease. 2. 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 Electronically Signed   By: Lajean Manes M.D.   On: 05/06/2016 20:38  Ct Head Wo Contrast  Result Date: 05/07/2016 CLINICAL DATA:  75 year old male with lightheadedness and vomiting EXAM: CT HEAD WITHOUT CONTRAST TECHNIQUE: Contiguous axial images were obtained from the base of the skull through the vertex without intravenous contrast. COMPARISON:  None. FINDINGS: There is mild dilatation of the ventricles out of proportion with the sulci which may represent central volume loss versus normal pressure hydrocephalus. Clinical correlation is recommended. Check mild periventricular and deep white matter chronic microvascular ischemic changes noted. There is no acute intracranial hemorrhage. No mass effect or midline shift noted. There is opacification of the right frontal sinus and multiple right ethmoid air cells. No air-fluid levels. The mastoid air cells are clear. The calvarium is intact. IMPRESSION: No acute intracranial hemorrhage. Mild age-related atrophy and chronic microvascular ischemic disease. If symptoms persist and there are no contraindications, MRI may provide better evaluation if clinically indicated Electronically Signed   By: Anner Crete M.D.   On: 05/07/2016 00:19  Ct Renal Stone Study  Result Date: 05/07/2016 CLINICAL DATA:  75 year old male with lightheadedness and vomiting EXAM: CT ABDOMEN AND PELVIS WITHOUT CONTRAST TECHNIQUE: Multidetector CT imaging of the abdomen and pelvis was performed following the  standard protocol without IV contrast. COMPARISON:  Chest CT dated 08/17/2014 FINDINGS: Evaluation of this exam is limited in the absence of intravenous contrast. There are emphysematous changes of the visualized lung bases. A 7 mm subpleural nodule in the left lower low lobe (series 2 image 8) appears similar to study dated 08/17/2014. There is calcification of the mitral annulus. No intra-abdominal free air or free fluid. There is mild eventration of the right hemidiaphragm. There multiple stones within the gallbladder. No pericholecystic fluid or evidence of inflammation on CT. The liver, pancreas, spleen, adrenal glands appear unremarkable. There is a  10 mm stone in the left renal pelvis at the left ureteropelvic junction. There is no hydronephrosis. The right kidney appears unremarkable. An ill-defined subcentimeter hypodense lesion may be present in the upper superior pole of the right kidney. Ultrasound is recommended for further evaluation. The visualized ureters and urinary bladder appear unremarkable. The prostate and seminal vesicles are not well evaluated due to streak artifact caused by metallic hip arthroplasties. There is no evidence of bowel obstruction or active inflammation. There scattered colonic diverticula without active inflammatory changes. Normal appendix. There is advanced aortoiliac atherosclerotic disease. 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 There is atherosclerotic calcification of the ostia of the renal arteries. No portal venous gas identified. There is no adenopathy. The abdominal wall soft tissues appear unremarkable. There is degenerative changes of the spine with multilevel disc desiccation and vacuum phenomena with multilevel endplate irregularity noted. No acute fracture. IMPRESSION: A 1 cm left UPJ stone.  No hydronephrosis. Cholelithiasis. Colonic diverticulosis. No evidence of bowel obstruction or active inflammation. Normal appendix. Electronically Signed   By: Anner Crete M.D.   On: 05/07/2016 00:26    Assessment/Plan: 75 year old male presenting with GI complaints and confusion.  BP elevated on admission.  Blood work unremarkable with only some mild renal insufficiency noted.  Head CT personally reviewed and shows no acute changes.  With elevated BP can not rule out PRES.  With GI disturbance would provide vitamin supplementation as well.  TSH normal.    Recommendations:  1.  MRI of the brain without contrast 2.  B1, B12, folate, ESR 3.  BP control  Alexis Goodell, MD Neurology [phone] 05/07/2016, 10:45 AM

## 2016-05-07 NOTE — Consult Note (Signed)
Urology Consult  Referring physician: Dr. Ether Griffins Reason for referral: nephrolithiasis  Chief Complaint: abdominal pain  History of Present Illness: Mr Jonathan Howell is a 75yo with a hx of dementia admitted with altered mental status, diarrhea, vomiting and abdominal pain. He developed diffise sharp, intermittent, nonradiating abdominal pain 2 days ago and presented to the ER. He underwent CT scan which showed a 1cm left UPJ calculus with no hydronephrosis. He denies any localizing flank pain. He denies any LUTS. No hematuria. He has never had a stone. Currently he denies nausea/vomiting. No other exacerbating /alleviating events Past Medical History:  Diagnosis Date  . COPD (chronic obstructive pulmonary disease) ([employer])   . Hypertension   . Personal history of tobacco use, presenting hazards to health 07/14/2015   History reviewed. No pertinent surgical history.  Medications: I have reviewed the patient's current medications. Allergies: No Known Allergies  History reviewed. No pertinent family history. Social History:  reports that he has been smoking.  He has never used smokeless tobacco. He reports that he does not drink alcohol or use drugs.  Review of Systems  Gastrointestinal: Positive for abdominal pain.  All other systems reviewed and are negative.   Physical Exam:  Vital signs in last 24 hours: Temp:  [98.5 F (36.9 C)-98.9 F (37.2 C)] 98.5 F (36.9 C) (07/25 2011) Pulse Rate:  [83-102] 98 (07/25 2011) Resp:  [15-23] 20 (07/25 2011) BP: (113-167)/(68-115) 166/94 (07/25 2011) SpO2:  [88 %-98 %] 98 % (07/25 2011) Weight:  [65.3 kg (144 lb)] 65.3 kg (144 lb) (07/25 0514) Physical Exam  Constitutional: He is oriented to person, place, and time. He appears well-developed and well-nourished.  HENT:  Head: Normocephalic and atraumatic.  Eyes: EOM are normal. Pupils are equal, round, and reactive to light.  Neck: Normal range of motion. No thyromegaly present.  Cardiovascular:  Normal rate and regular rhythm.   Respiratory: Effort normal. No respiratory distress.  GI: Soft. He exhibits no distension.  Genitourinary: No penile tenderness.  Musculoskeletal: Normal range of motion. He exhibits no edema.  Neurological: He is alert and oriented to person, place, and time.  Skin: Skin is warm and dry.  Psychiatric: He has a normal mood and affect. His behavior is normal. Thought content normal.    Laboratory Data:  Results for orders placed or performed during the hospital encounter of 05/06/16 (from the past 72 hour(s))  Lipase, blood     Status: None   Collection Time: 05/06/16  7:27 PM  Result Value Ref Range   Lipase 34 11 - 51 U/L  Comprehensive metabolic panel     Status: Abnormal   Collection Time: 05/06/16  7:27 PM  Result Value Ref Range   Sodium 136 135 - 145 mmol/L   Potassium 3.5 3.5 - 5.1 mmol/L   Chloride 104 101 - 111 mmol/L   CO2 24 22 - 32 mmol/L   Glucose, Bld 91 65 - 99 mg/dL   BUN 18 6 - 20 mg/dL   Creatinine, Ser 1.39 (H) 0.61 - 1.24 mg/dL   Calcium 8.4 (L) 8.9 - 10.3 mg/dL   Total Protein 7.5 6.5 - 8.1 g/dL   Albumin 3.9 3.5 - 5.0 g/dL   AST 18 15 - 41 U/L   ALT 11 (L) 17 - 63 U/L   Alkaline Phosphatase 61 38 - 126 U/L   Total Bilirubin 0.3 0.3 - 1.2 mg/dL   GFR calc non Af Amer 48 (L) >60 mL/min   GFR calc Af Amer 56 (  L) >60 mL/min    Comment: (NOTE) The eGFR has been calculated using the CKD EPI equation. This calculation has not been validated in all clinical situations. eGFR's persistently <60 mL/min signify possible Chronic Kidney Disease.    Anion gap 8 5 - 15  CBC     Status: None   Collection Time: 05/06/16  7:27 PM  Result Value Ref Range   WBC 8.0 3.8 - 10.6 K/uL   RBC 4.93 4.40 - 5.90 MIL/uL   Hemoglobin 14.3 13.0 - 18.0 g/dL   HCT 42.6 40.0 - 52.0 %   MCV 86.5 80.0 - 100.0 fL   MCH 29.1 26.0 - 34.0 pg   MCHC 33.7 32.0 - 36.0 g/dL   RDW 14.4 11.5 - 14.5 %   Platelets 302 150 - 440 K/uL  Ethanol     Status:  None   Collection Time: 05/06/16  7:27 PM  Result Value Ref Range   Alcohol, Ethyl (B) <5 <5 mg/dL    Comment:        LOWEST DETECTABLE LIMIT FOR SERUM ALCOHOL IS 5 mg/dL FOR MEDICAL PURPOSES ONLY   Troponin I     Status: None   Collection Time: 05/06/16  7:27 PM  Result Value Ref Range   Troponin I <0.03 <0.03 ng/mL  Urinalysis complete, with microscopic     Status: Abnormal   Collection Time: 05/06/16  8:40 PM  Result Value Ref Range   Color, Urine YELLOW (A) YELLOW   APPearance CLEAR (A) CLEAR   Glucose, UA NEGATIVE NEGATIVE mg/dL   Bilirubin Urine NEGATIVE NEGATIVE   Ketones, ur TRACE (A) NEGATIVE mg/dL   Specific Gravity, Urine 1.016 1.005 - 1.030   Hgb urine dipstick NEGATIVE NEGATIVE   pH 6.0 5.0 - 8.0   Protein, ur 30 (A) NEGATIVE mg/dL   Nitrite NEGATIVE NEGATIVE   Leukocytes, UA NEGATIVE NEGATIVE   RBC / HPF 0-5 0 - 5 RBC/hpf   WBC, UA 0-5 0 - 5 WBC/hpf   Bacteria, UA RARE (A) NONE SEEN   Squamous Epithelial / LPF NONE SEEN NONE SEEN   Mucous PRESENT    Hyaline Casts, UA PRESENT   Ammonia     Status: None   Collection Time: 05/06/16  8:41 PM  Result Value Ref Range   Ammonia 19 9 - 35 umol/L  Urine Drug Screen, Qualitative ([facility] only)     Status: None   Collection Time: 05/06/16  8:41 PM  Result Value Ref Range   Tricyclic, Ur Screen NONE DETECTED NONE DETECTED   Amphetamines, Ur Screen NONE DETECTED NONE DETECTED   MDMA (Ecstasy)Ur Screen NONE DETECTED NONE DETECTED   Cocaine Metabolite,Ur [place] NONE DETECTED NONE DETECTED   Opiate, Ur Screen NONE DETECTED NONE DETECTED   Phencyclidine (PCP) Ur S NONE DETECTED NONE DETECTED   Cannabinoid 50 Ng, Ur  NONE DETECTED NONE DETECTED   Barbiturates, Ur Screen NONE DETECTED NONE DETECTED   Benzodiazepine, Ur Scrn NONE DETECTED NONE DETECTED   Methadone Scn, Ur NONE DETECTED NONE DETECTED    Comment: (NOTE) 093  Tricyclics, urine               Cutoff 1000 ng/mL 200  Amphetamines, urine             Cutoff 1000  ng/mL 300  MDMA (Ecstasy), urine           Cutoff 500 ng/mL 400  Cocaine Metabolite, urine       Cutoff 300 ng/mL 500  Opiate, urine                   Cutoff 300 ng/mL 600  Phencyclidine (PCP), urine      Cutoff 25 ng/mL 700  Cannabinoid, urine              Cutoff 50 ng/mL 800  Barbiturates, urine             Cutoff 200 ng/mL 900  Benzodiazepine, urine           Cutoff 200 ng/mL 1000 Methadone, urine                Cutoff 300 ng/mL 1100 1200 The urine drug screen provides only a preliminary, unconfirmed 1300 analytical test result and should not be used for non-medical 1400 purposes. Clinical consideration and professional judgment should 1500 be applied to any positive drug screen result due to possible 1600 interfering substances. A more specific alternate chemical method 1700 must be used in order to obtain a confirmed analytical result.  1800 Gas chromato graphy / mass spectrometry (GC/MS) is the preferred 1900 confirmatory method.   TSH     Status: None   Collection Time: 05/07/16  6:08 AM  Result Value Ref Range   TSH 1.162 0.350 - 4.500 uIU/mL  Troponin I     Status: None   Collection Time: 05/07/16  6:08 AM  Result Value Ref Range   Troponin I <0.03 <0.03 ng/mL  Hemoglobin A1c     Status: None   Collection Time: 05/07/16  6:08 AM  Result Value Ref Range   Hgb A1c MFr Bld 5.5 4.0 - 6.0 %  Comprehensive metabolic panel     Status: Abnormal   Collection Time: 05/07/16  6:08 AM  Result Value Ref Range   Sodium 135 135 - 145 mmol/L   Potassium 2.9 (L) 3.5 - 5.1 mmol/L   Chloride 102 101 - 111 mmol/L   CO2 22 22 - 32 mmol/L   Glucose, Bld 121 (H) 65 - 99 mg/dL   BUN 21 (H) 6 - 20 mg/dL   Creatinine, Ser 1.26 (H) 0.61 - 1.24 mg/dL   Calcium 8.6 (L) 8.9 - 10.3 mg/dL   Total Protein 7.3 6.5 - 8.1 g/dL   Albumin 3.7 3.5 - 5.0 g/dL   AST 16 15 - 41 U/L   ALT 10 (L) 17 - 63 U/L   Alkaline Phosphatase 58 38 - 126 U/L   Total Bilirubin 1.0 0.3 - 1.2 mg/dL   GFR calc non Af  Amer 54 (L) >60 mL/min   GFR calc Af Amer >60 >60 mL/min    Comment: (NOTE) The eGFR has been calculated using the CKD EPI equation. This calculation has not been validated in all clinical situations. eGFR's persistently <60 mL/min signify possible Chronic Kidney Disease.    Anion gap 11 5 - 15  CBC     Status: Abnormal   Collection Time: 05/07/16  6:08 AM  Result Value Ref Range   WBC 9.7 3.8 - 10.6 K/uL   RBC 4.91 4.40 - 5.90 MIL/uL   Hemoglobin 14.6 13.0 - 18.0 g/dL   HCT 42.4 40.0 - 52.0 %   MCV 86.4 80.0 - 100.0 fL   MCH 29.7 26.0 - 34.0 pg   MCHC 34.3 32.0 - 36.0 g/dL   RDW 14.6 (H) 11.5 - 14.5 %   Platelets 304 150 - 440 K/uL  Occult blood card to lab, stool     Status: None  Collection Time: 05/07/16  9:00 AM  Result Value Ref Range   Fecal Occult Bld NEGATIVE NEGATIVE  Troponin I     Status: None   Collection Time: 05/07/16 12:32 PM  Result Value Ref Range   Troponin I <0.03 <0.03 ng/mL  Magnesium     Status: None   Collection Time: 05/07/16 12:32 PM  Result Value Ref Range   Magnesium 1.9 1.7 - 2.4 mg/dL  Vitamin B12     Status: None   Collection Time: 05/07/16 12:32 PM  Result Value Ref Range   Vitamin B-12 197 180 - 914 pg/mL    Comment: (NOTE) This assay is not validated for testing neonatal or myeloproliferative syndrome specimens for Vitamin B12 levels. Performed at [facility]   Sedimentation rate     Status: Abnormal   Collection Time: 05/07/16 12:32 PM  Result Value Ref Range   Sed Rate 29 (H) 0 - 20 mm/hr  Folate     Status: None   Collection Time: 05/07/16 12:32 PM  Result Value Ref Range   Folate 6.6 >5.9 ng/mL  Potassium     Status: None   Collection Time: 05/07/16  3:07 PM  Result Value Ref Range   Potassium 4.3 3.5 - 5.1 mmol/L  Troponin I     Status: None   Collection Time: 05/07/16  5:27 PM  Result Value Ref Range   Troponin I <0.03 <0.03 ng/mL   No results found for this or any previous visit (from the past 240  hour(s)). Creatinine:  Recent Labs  05/06/16 1927 05/07/16 0608  CREATININE 1.39* 1.26*   Baseline Creatinine: 1.1  Impression/Assessment:  75yo with left UPJ calculus  Plan:  1. I dicussed the various treatments for large proximal ureteral/ renal calculi including ESWL, ureteroscopy, PCNL and after discussing treatment options the patient and daughter elected to proceed with ESWL. He will be schedule for 8/3.  He was instructed to stop has ASA 72 hours prior to procedure. He can be discharge home on flomax 0.46m daily and narcotic analgesia. [facility]  PNicolette Bang7/25/2017, 9:06 PM

## 2016-05-07 NOTE — H&P (Addendum)
Jonathan Howell is an 75 y.o. male.   Chief Complaint: Abdominal pain, altered mental status.  HPI:  History obtained from ED physician and daughter.  Patient unreliable due to mental status change.   Jonathan Howell is a 75 y.o. male with a history of hypertension, COPD, bipolar d/o presenting with altered mental status, diarrhea.   The patient initially reported 2 days of lower abdominal pain with several episodes of diarrhea after eating "weenies with beenies" yesterday and 5 episodes of vomiting today.   His daughter is more concerned that today he was confused. Normally he is alert and oriented 3 and is able to answer questions appropriately, but throughout the day he has become more and more confused.  On my questioning, the daughter states that none of his answers to my questions are accurate.  She does report that he usually takes Seroquel which he hasn't taken in several nights because he ran out.  He has been sleep deprived over the past few days.  Daughter denies current tobacco and alcohol use. No known trauma. The patient has not anticoagulated.  Otherwise no recent changes to his status.  No change in diet, no sick contacts, recent illness or travel.    Past Medical History:  Diagnosis Date  . COPD (chronic obstructive pulmonary disease) ([place])   . Hypertension   . Personal history of tobacco use, presenting hazards to health 07/14/2015    History reviewed. No pertinent surgical history.  History reviewed. No pertinent family history. Social History:  reports that he has been smoking.  He has never used smokeless tobacco. He reports that he does not drink alcohol or use drugs.  Prior history of tobacco use, not currently per daughter.   Allergies: No Known Allergies  Meds: reviewed.   Results for orders placed or performed during the hospital encounter of 05/06/16 (from the past 48 hour(s))  Lipase, blood     Status: None   Collection Time: 05/06/16  7:27 PM  Result Value  Ref Range   Lipase 34 11 - 51 U/L  Comprehensive metabolic panel     Status: Abnormal   Collection Time: 05/06/16  7:27 PM  Result Value Ref Range   Sodium 136 135 - 145 mmol/L   Potassium 3.5 3.5 - 5.1 mmol/L   Chloride 104 101 - 111 mmol/L   CO2 24 22 - 32 mmol/L   Glucose, Bld 91 65 - 99 mg/dL   BUN 18 6 - 20 mg/dL   Creatinine, Ser 1.39 (H) 0.61 - 1.24 mg/dL   Calcium 8.4 (L) 8.9 - 10.3 mg/dL   Total Protein 7.5 6.5 - 8.1 g/dL   Albumin 3.9 3.5 - 5.0 g/dL   AST 18 15 - 41 U/L   ALT 11 (L) 17 - 63 U/L   Alkaline Phosphatase 61 38 - 126 U/L   Total Bilirubin 0.3 0.3 - 1.2 mg/dL   GFR calc non Af Amer 48 (L) >60 mL/min   GFR calc Af Amer 56 (L) >60 mL/min    Comment: (NOTE) The eGFR has been calculated using the CKD EPI equation. This calculation has not been validated in all clinical situations. eGFR's persistently <60 mL/min signify possible Chronic Kidney Disease.    Anion gap 8 5 - 15  CBC     Status: None   Collection Time: 05/06/16  7:27 PM  Result Value Ref Range   WBC 8.0 3.8 - 10.6 K/uL   RBC 4.93 4.40 - 5.90 MIL/uL   Hemoglobin  14.3 13.0 - 18.0 g/dL   HCT 42.6 40.0 - 52.0 %   MCV 86.5 80.0 - 100.0 fL   MCH 29.1 26.0 - 34.0 pg   MCHC 33.7 32.0 - 36.0 g/dL   RDW 14.4 11.5 - 14.5 %   Platelets 302 150 - 440 K/uL  Ethanol     Status: None   Collection Time: 05/06/16  7:27 PM  Result Value Ref Range   Alcohol, Ethyl (B) <5 <5 mg/dL    Comment:        LOWEST DETECTABLE LIMIT FOR SERUM ALCOHOL IS 5 mg/dL FOR MEDICAL PURPOSES ONLY   Troponin I     Status: None   Collection Time: 05/06/16  7:27 PM  Result Value Ref Range   Troponin I <0.03 <0.03 ng/mL  Urinalysis complete, with microscopic     Status: Abnormal   Collection Time: 05/06/16  8:40 PM  Result Value Ref Range   Color, Urine YELLOW (A) YELLOW   APPearance CLEAR (A) CLEAR   Glucose, UA NEGATIVE NEGATIVE mg/dL   Bilirubin Urine NEGATIVE NEGATIVE   Ketones, ur TRACE (A) NEGATIVE mg/dL   Specific  Gravity, Urine 1.016 1.005 - 1.030   Hgb urine dipstick NEGATIVE NEGATIVE   pH 6.0 5.0 - 8.0   Protein, ur 30 (A) NEGATIVE mg/dL   Nitrite NEGATIVE NEGATIVE   Leukocytes, UA NEGATIVE NEGATIVE   RBC / HPF 0-5 0 - 5 RBC/hpf   WBC, UA 0-5 0 - 5 WBC/hpf   Bacteria, UA RARE (A) NONE SEEN   Squamous Epithelial / LPF NONE SEEN NONE SEEN   Mucous PRESENT    Hyaline Casts, UA PRESENT   Ammonia     Status: None   Collection Time: 05/06/16  8:41 PM  Result Value Ref Range   Ammonia 19 9 - 35 umol/L  Urine Drug Screen, Qualitative ([facility] only)     Status: None   Collection Time: 05/06/16  8:41 PM  Result Value Ref Range   Tricyclic, Ur Screen NONE DETECTED NONE DETECTED   Amphetamines, Ur Screen NONE DETECTED NONE DETECTED   MDMA (Ecstasy)Ur Screen NONE DETECTED NONE DETECTED   Cocaine Metabolite,Ur [place] NONE DETECTED NONE DETECTED   Opiate, Ur Screen NONE DETECTED NONE DETECTED   Phencyclidine (PCP) Ur S NONE DETECTED NONE DETECTED   Cannabinoid 50 Ng, Ur [place] NONE DETECTED NONE DETECTED   Barbiturates, Ur Screen NONE DETECTED NONE DETECTED   Benzodiazepine, Ur Scrn NONE DETECTED NONE DETECTED   Methadone Scn, Ur NONE DETECTED NONE DETECTED    Comment: (NOTE) 096  Tricyclics, urine               Cutoff 1000 ng/mL 200  Amphetamines, urine             Cutoff 1000 ng/mL 300  MDMA (Ecstasy), urine           Cutoff 500 ng/mL 400  Cocaine Metabolite, urine       Cutoff 300 ng/mL 500  Opiate, urine                   Cutoff 300 ng/mL 600  Phencyclidine (PCP), urine      Cutoff 25 ng/mL 700  Cannabinoid, urine              Cutoff 50 ng/mL 800  Barbiturates, urine             Cutoff 200 ng/mL 900  Benzodiazepine, urine  Cutoff 200 ng/mL 1000 Methadone, urine                Cutoff 300 ng/mL 1100 1200 The urine drug screen provides only a preliminary, unconfirmed 1300 analytical test result and should not be used for non-medical 1400 purposes. Clinical consideration and professional judgment  should 1500 be applied to any positive drug screen result due to possible 1600 interfering substances. A more specific alternate chemical method 1700 must be used in order to obtain a confirmed analytical result.  1800 Gas chromato graphy / mass spectrometry (GC/MS) is the preferred 1900 confirmatory method.    Dg Chest 2 View  Result Date: 05/06/2016 CLINICAL DATA:  Per pt family, pt has been having cp, abdominal pain, weakness and AMS today. Pt hx of asthma, COPD, emphysema per family members. Pt was a smoker. EXAM: CHEST  2 VIEW COMPARISON:  Chest CT, 02/28/2015.  Chest radiograph, 01/30/2015. FINDINGS: Cardiac silhouette is normal in size and configuration. The aorta is mildly uncoiled. No mediastinal or hilar masses or convincing adenopathy. Lungs are hyperexpanded. Irregular interstitial thickening in the lung bases consistent with fibrosis is stable. There are advanced changes of emphysema, also stable. No evidence of pneumonia or pulmonary edema. No pleural effusion or pneumothorax. Bony thorax is demineralized. There arthropathic changes of both shoulders, stable. IMPRESSION: 1. No acute cardiopulmonary disease. 2. 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 Electronically Signed   By: Lajean Manes M.D.   On: 05/06/2016 20:38  Ct Head Wo Contrast  Result Date: 05/07/2016 CLINICAL DATA:  75 year old male with lightheadedness and vomiting EXAM: CT HEAD WITHOUT CONTRAST TECHNIQUE: Contiguous axial images were obtained from the base of the skull through the vertex without intravenous contrast. COMPARISON:  None. FINDINGS: There is mild dilatation of the ventricles out of proportion with the sulci which may represent central volume loss versus normal pressure hydrocephalus. Clinical correlation is recommended. Check mild periventricular and deep white matter chronic microvascular ischemic changes noted. There is no acute intracranial hemorrhage. No mass effect or midline  shift noted. There is opacification of the right frontal sinus and multiple right ethmoid air cells. No air-fluid levels. The mastoid air cells are clear. The calvarium is intact. IMPRESSION: No acute intracranial hemorrhage. Mild age-related atrophy and chronic microvascular ischemic disease. If symptoms persist and there are no contraindications, MRI may provide better evaluation if clinically indicated Electronically Signed   By: Anner Crete M.D.   On: 05/07/2016 00:19  Ct Renal Stone Study  Result Date: 05/07/2016 CLINICAL DATA:  75 year old male with lightheadedness and vomiting EXAM: CT ABDOMEN AND PELVIS WITHOUT CONTRAST TECHNIQUE: Multidetector CT imaging of the abdomen and pelvis was performed following the standard protocol without IV contrast. COMPARISON:  Chest CT dated 08/17/2014 FINDINGS: Evaluation of this exam is limited in the absence of intravenous contrast. There are emphysematous changes of the visualized lung bases. A 7 mm subpleural nodule in the left lower low lobe (series 2 image 8) appears similar to study dated 08/17/2014. There is calcification of the mitral annulus. No intra-abdominal free air or free fluid. There is mild eventration of the right hemidiaphragm. There multiple stones within the gallbladder. No pericholecystic fluid or evidence of inflammation on CT. The liver, pancreas, spleen, adrenal glands appear unremarkable. There is a 10 mm stone in the left renal pelvis at the left ureteropelvic junction. There is no hydronephrosis. The right kidney appears unremarkable. An ill-defined subcentimeter hypodense lesion may be present in the upper superior  pole of the right kidney. Ultrasound is recommended for further evaluation. The visualized ureters and urinary bladder appear unremarkable. The prostate and seminal vesicles are not well evaluated due to streak artifact caused by metallic hip arthroplasties. There is no evidence of bowel obstruction or active inflammation.  There scattered colonic diverticula without active inflammatory changes. Normal appendix. There is advanced aortoiliac atherosclerotic disease. 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 There is atherosclerotic calcification of the ostia of the renal arteries. No portal venous gas identified. There is no adenopathy. The abdominal wall soft tissues appear unremarkable. There is degenerative changes of the spine with multilevel disc desiccation and vacuum phenomena with multilevel endplate irregularity noted. No acute fracture. IMPRESSION: A 1 cm left UPJ stone.  No hydronephrosis. Cholelithiasis. Colonic diverticulosis. No evidence of bowel obstruction or active inflammation. Normal appendix. Electronically Signed   By: Anner Crete M.D.   On: 05/07/2016 00:26   Review of Systems  Unable to perform ROS: Mental status change   Patient confused, giving incorrect information per daughter.    Blood pressure (!) 159/115, pulse 95, temperature 99.3 F (37.4 C), temperature source Oral, resp. rate 15, height 5' 2"  (1.575 m), weight 78.9 kg (174 lb), SpO2 94 %.   Physical Exam  GENERAL: 75 year old black male patient, well-developed, well-nourished lying in the bed in no acute distress.  Pleasant and cooperative. Conversive but confused - answers questions but with incorrect answers.   EYES: Pupils equal, round, reactive to light and accommodation. No scleral icterus. Extraocular muscles intact. HEENT: Head atraumatic, normocephalic. Oropharynx and nasopharynx clear. Mucus membranes moist. NECK: Supple, full range of motion. No jugular venous distention. No thyroid enlargement, no tenderness, no lymphadenopathy.  No bruits heard.  LUNGS: Normal breath sounds bilaterally, no wheezing, rales, rhonchi or crepitation. No use of accessory muscles of respiration. CARDIOVASCULAR: S1, S2 normal. No murmurs, rubs, or gallops. Cap refill <2 seconds. ABDOMEN: Soft, nontender, nondistended. No rebound, guarding,  rigidity. Normoactive bowel sounds present in all four quadrants. No organomegaly or mass. EXTREMITIES: Full range of motion. No pedal edema, cyanosis, or clubbing. NEUROLOGIC: Cranial nerves II through XII are grossly intact with no focal sensorimotor deficit. Muscle strength 5/5 in all extremities. Sensation intact. Gait not checked.  Patient cooperates with exam.  Answers all questions but answers are incorrect or inappropriate.  States the year is 67, president is Obama, that he has six grandchildren and still works in [employer], that he drinks excessively all day (none of which are accurate).   Answers all yes or no questions with "of course." PSYCHIATRIC: The patient is alert and oriented x 1. Normal affect and mood per daughter.  SKIN: Warm, dry, and intact without obvious rash, lesion, or ulcer.  EKG:  NSR@91bpm , non ischemic.     Assessment/Plan This is a 75 year old black male with COPD and HTN presenting with AMS.  I am unsure how reliable his history of abdominal pain and rectal bleeding are given his worsening confusion.  Initially ED evaluation does not point to any infection or toxic/metabolic causes of his mental status change.  Differential includes TIA/CVA though prelim imaging is negative, hypertensive urgency with elevated BP and AMS, infection.  We will admit to telemetry for monitoring, neurochecks and BP control.  I have ordered an MRI, MRA, carotids, cultures and neuro consult for the morning along with trops, lipids, TSH for risk stratification.  Will also check FOBT given reported rectal bleeding, however H/H is stable and patient  has history of hemorrhoids.    He also has an AKI for which we will hydrate gently and repeat labs in AM.    We will continue his regular home medications, routine DVT px.   The plan of care has been discussed with the patient and his daughter.  She expressed understanding of the plan and agrees.    [employer], DO 05/07/2016, 2:26  AM

## 2016-05-07 NOTE — ED Notes (Signed)
Spoke with hospitalist Dr. Estanislado Pandy due to pt elevated blood pressure after Lopressor, verbal order given, see MAR.

## 2016-05-07 NOTE — Progress Notes (Signed)
baldder scan - 168ms

## 2016-05-07 NOTE — ED Notes (Signed)
Hospitalist at bedside 

## 2016-05-07 NOTE — ED Notes (Addendum)
Dr. Joni Fears notified of pt elevated blood pressure, pt denies chest pain, headache, or any symptoms.

## 2016-05-08 ENCOUNTER — Observation Stay: Payer: Medicare Other

## 2016-05-08 DIAGNOSIS — R410 Disorientation, unspecified: Secondary | ICD-10-CM | POA: Diagnosis not present

## 2016-05-08 DIAGNOSIS — R4182 Altered mental status, unspecified: Secondary | ICD-10-CM | POA: Diagnosis not present

## 2016-05-08 LAB — BASIC METABOLIC PANEL
Anion gap: 6 (ref 5–15)
BUN: 20 mg/dL (ref 6–20)
CALCIUM: 8.4 mg/dL — AB (ref 8.9–10.3)
CO2: 21 mmol/L — AB (ref 22–32)
CREATININE: 1.16 mg/dL (ref 0.61–1.24)
Chloride: 106 mmol/L (ref 101–111)
GFR calc Af Amer: >60 mL/min (ref >60)
GFR calc non Af Amer: >60 mL/min (ref >60)
GLUCOSE: 83 mg/dL (ref 65–99)
Potassium: 4 mmol/L (ref 3.5–5.1)
Sodium: 133 mmol/L — ABNORMAL LOW (ref 135–145)

## 2016-05-08 LAB — URINE CULTURE: CULTURE: NO GROWTH

## 2016-05-08 NOTE — Care Management CC44 (Signed)
Condition Code 44 Documentation Completed  Patient Details  Name: Jathniel Smeltzer MRN: [id number] Date of Birth: [birth year]   Condition Code 44 given:   05/08/2016 Patient signature on Condition Code 44 notice:   05/08/2016 Documentation of 2 MD's agreement:    7/26 / 2017 Code 44 added to claim:   05/08/2016    Katrina Stack, RN 05/08/2016, 8:51 AM

## 2016-05-08 NOTE — Progress Notes (Signed)
[place] [facility] [place] NAME: Jonathan Howell    MR#:  [id number]  DATE OF BIRTH:  09/30/1941  SUBJECTIVE:  CHIEF COMPLAINT:   Chief Complaint  Patient presents with  . Abdominal Pain  . Diarrhea  Patient is 75 year old African-American male with past medical history significant for history of essential hypertension, COPD, bipolar disorder, who presents with altered mental status and diarrhea, nausea. Few episodes of stool today, unclear this diarrheal. Patient is has improved as his confusion. However, still remains confabulatory, per neurologist, reinitiated on Seroquel yesterday  Review of Systems  Unable to perform ROS: Mental status change  Constitutional: Negative for chills, fever and weight loss.  HENT: Negative for congestion.   Eyes: Negative for blurred vision and double vision.  Respiratory: Negative for sputum production.   Cardiovascular: Negative for palpitations, orthopnea, leg swelling and PND.  Gastrointestinal: Negative for abdominal pain, blood in stool, constipation, diarrhea, nausea and vomiting.  Genitourinary: Negative for dysuria, frequency, hematuria and urgency.  Musculoskeletal: Negative for falls.  Neurological: Negative for dizziness, tremors, focal weakness and headaches.  Endo/Heme/Allergies: Does not bruise/bleed easily.  Psychiatric/Behavioral: Negative for depression. The patient does not have insomnia.     VITAL SIGNS: Blood pressure (!) 147/94, pulse (!) 102, temperature 98.2 F (36.8 C), temperature source Oral, resp. rate 18, height '5\' 2"'$  (1.575 m), weight 65.3 kg (144 lb), SpO2 94 %.  PHYSICAL EXAMINATION:   GENERAL:  75 y.o.-year-old patient lying in the bed with no acute distress. Comfortable sitting on the bed EYES: Pupils equal, round, reactive to light and accommodation. No scleral icterus. Extraocular muscles intact.  HEENT: Head atraumatic, normocephalic. Oropharynx and nasopharynx clear.   NECK:  Supple, no jugular venous distention. No thyroid enlargement, no tenderness.  LUNGS: Normal breath sounds bilaterally, no wheezing, rales,rhonchi or crepitation. No use of accessory muscles of respiration.  CARDIOVASCULAR: S1, S2 normal. No murmurs, rubs, or gallops.  ABDOMEN: Soft, nontender, nondistended. Bowel sounds present. No organomegaly or mass.  EXTREMITIES: No pedal edema, cyanosis, or clubbing.  NEUROLOGIC: Cranial nerves II through XII are intact. Muscle strength 5/5 in all extremities. Sensation intact. Gait not checked.  PSYCHIATRIC: The patient is alert and oriented x 3, , although  patient is not sure why he is in the hospital, feels confused.  SKIN: No obvious rash, lesion, or ulcer.   ORDERS/RESULTS REVIEWED:   CBC  Recent Labs Lab 05/06/16 1927 05/07/16 0608  WBC 8.0 9.7  HGB 14.3 14.6  HCT 42.6 42.4  PLT 302 304  MCV 86.5 86.4  MCH 29.1 29.7  MCHC 33.7 34.3  RDW 14.4 14.6*   ------------------------------------------------------------------------------------------------------------------  Chemistries   Recent Labs Lab 05/06/16 1927 05/07/16 0608 05/07/16 1232 05/07/16 1507 05/08/16 0343  NA 136 135  --   --  133*  K 3.5 2.9*  --  4.3 4.0  CL 104 102  --   --  106  CO2 24 22  --   --  21*  GLUCOSE 91 121*  --   --  83  BUN 18 21*  --   --  20  CREATININE 1.39* 1.26*  --   --  1.16  CALCIUM 8.4* 8.6*  --   --  8.4*  MG  --   --  1.9  --   --   AST 18 16  --   --   --   ALT 11* 10*  --   --   --  ALKPHOS 61 58  --   --   --   BILITOT 0.3 1.0  --   --   --    ------------------------------------------------------------------------------------------------------------------ estimated creatinine clearance is 43.1 mL/min (by C-G formula based on SCr of 1.16 mg/dL). ------------------------------------------------------------------------------------------------------------------  Recent Labs  05/07/16 0608  TSH 1.162    Cardiac  Enzymes  Recent Labs Lab 05/07/16 0608 05/07/16 1232 05/07/16 1727  TROPONINI <0.03 <0.03 <0.03   ------------------------------------------------------------------------------------------------------------------ Invalid input(s): POCBNP ---------------------------------------------------------------------------------------------------------------  RADIOLOGY: Dg Chest 2 View  Result Date: 05/06/2016 CLINICAL DATA:  Per pt family, pt has been having cp, abdominal pain, weakness and AMS today. Pt hx of asthma, COPD, emphysema per family members. Pt was a smoker. EXAM: CHEST  2 VIEW COMPARISON:  Chest CT, 02/28/2015.  Chest radiograph, 01/30/2015. FINDINGS: Cardiac silhouette is normal in size and configuration. The aorta is mildly uncoiled. No mediastinal or hilar masses or convincing adenopathy. Lungs are hyperexpanded. Irregular interstitial thickening in the lung bases consistent with fibrosis is stable. There are advanced changes of emphysema, also stable. No evidence of pneumonia or pulmonary edema. No pleural effusion or pneumothorax. Bony thorax is demineralized. There arthropathic changes of both shoulders, stable. IMPRESSION: 1. No acute cardiopulmonary disease. 2. 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 Electronically Signed   By: Lajean Manes M.D.   On: 05/06/2016 20:38  Dg Abd 1 View  Result Date: 05/07/2016 CLINICAL DATA:  Rectal bleeding with diarrhea. EXAM: ABDOMEN - 1 VIEW COMPARISON:  None. FINDINGS: Diffuse gaseous distention of large and small bowel not. Degenerative changes noted in the lumbar spine diffusely. Patient is status post bilateral hip replacement. IMPRESSION: Diffuse gaseous bowel distention. Component of underlying ileus suspected. Electronically Signed   By: Misty Stanley M.D.   On: 05/07/2016 15:55  Ct Head Wo Contrast  Result Date: 05/07/2016 CLINICAL DATA:  76 year old male with lightheadedness and vomiting EXAM: CT HEAD WITHOUT  CONTRAST TECHNIQUE: Contiguous axial images were obtained from the base of the skull through the vertex without intravenous contrast. COMPARISON:  None. FINDINGS: There is mild dilatation of the ventricles out of proportion with the sulci which may represent central volume loss versus normal pressure hydrocephalus. Clinical correlation is recommended. Check mild periventricular and deep white matter chronic microvascular ischemic changes noted. There is no acute intracranial hemorrhage. No mass effect or midline shift noted. There is opacification of the right frontal sinus and multiple right ethmoid air cells. No air-fluid levels. The mastoid air cells are clear. The calvarium is intact. IMPRESSION: No acute intracranial hemorrhage. Mild age-related atrophy and chronic microvascular ischemic disease. If symptoms persist and there are no contraindications, MRI may provide better evaluation if clinically indicated Electronically Signed   By: Anner Crete M.D.   On: 05/07/2016 00:19  US Carotid Bilateral  Result Date: 05/07/2016 CLINICAL DATA:  Altered mental status, syncope, hyperlipidemia, tobacco use EXAM: BILATERAL CAROTID DUPLEX ULTRASOUND TECHNIQUE: Pearline Cables scale imaging, color Doppler and duplex ultrasound were performed of bilateral carotid and vertebral arteries in the neck. COMPARISON:  Unavailable FINDINGS: Criteria: Quantification of carotid stenosis is based on velocity parameters that correlate the residual internal carotid diameter with NASCET-based stenosis levels, using the diameter of the distal internal carotid lumen as the denominator for stenosis measurement. The following velocity measurements were obtained: RIGHT ICA:  47/17 cm/sec CCA:  12/87 cm/sec SYSTOLIC ICA/CCA RATIO:  0.6 DIASTOLIC ICA/CCA RATIO:  0.9 ECA:  87 cm/sec LEFT ICA:  57/24 cm/sec CCA:  86/76 cm/sec SYSTOLIC ICA/CCA RATIO:  0.9 DIASTOLIC ICA/CCA RATIO:  1.8 ECA:  61 cm/sec RIGHT CAROTID ARTERY: Moderate heterogeneous  partially calcified atherosclerosis. Despite this, there is no significant luminal narrowing, ICA stenosis, velocity elevation, or turbulent flow. Degree of stenosis less than 50%. RIGHT VERTEBRAL ARTERY:  Antegrade LEFT CAROTID ARTERY: Similar mild to moderate heterogeneous carotid atherosclerosis with partial calcification. Despite this, there is no hemodynamically significant ICA stenosis, velocity elevation, or turbulent flow. Degree of narrowing also less than 50%. LEFT VERTEBRAL ARTERY:  Antegrade IMPRESSION: Right greater the left moderate carotid atherosclerosis. No hemodynamically significant ICA stenosis. Degree of narrowing less than 50% bilaterally. Patent antegrade vertebral flow bilaterally Electronically Signed   By: Jerilynn Mages.  Shick M.D.   On: 05/07/2016 14:59  Ct Renal Stone Study  Result Date: 05/07/2016 CLINICAL DATA:  75 year old male with lightheadedness and vomiting EXAM: CT ABDOMEN AND PELVIS WITHOUT CONTRAST TECHNIQUE: Multidetector CT imaging of the abdomen and pelvis was performed following the standard protocol without IV contrast. COMPARISON:  Chest CT dated 08/17/2014 FINDINGS: Evaluation of this exam is limited in the absence of intravenous contrast. There are emphysematous changes of the visualized lung bases. A 7 mm subpleural nodule in the left lower low lobe (series 2 image 8) appears similar to study dated 08/17/2014. There is calcification of the mitral annulus. No intra-abdominal free air or free fluid. There is mild eventration of the right hemidiaphragm. There multiple stones within the gallbladder. No pericholecystic fluid or evidence of inflammation on CT. The liver, pancreas, spleen, adrenal glands appear unremarkable. There is a 10 mm stone in the left renal pelvis at the left ureteropelvic junction. There is no hydronephrosis. The right kidney appears unremarkable. An ill-defined subcentimeter hypodense lesion may be present in the upper superior pole of the right kidney.  Ultrasound is recommended for further evaluation. The visualized ureters and urinary bladder appear unremarkable. The prostate and seminal vesicles are not well evaluated due to streak artifact caused by metallic hip arthroplasties. There is no evidence of bowel obstruction or active inflammation. There scattered colonic diverticula without active inflammatory changes. Normal appendix. There is advanced aortoiliac atherosclerotic disease. 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 There is atherosclerotic calcification of the ostia of the renal arteries. No portal venous gas identified. There is no adenopathy. The abdominal wall soft tissues appear unremarkable. There is degenerative changes of the spine with multilevel disc desiccation and vacuum phenomena with multilevel endplate irregularity noted. No acute fracture. IMPRESSION: A 1 cm left UPJ stone.  No hydronephrosis. Cholelithiasis. Colonic diverticulosis. No evidence of bowel obstruction or active inflammation. Normal appendix. Electronically Signed   By: Anner Crete M.D.   On: 05/07/2016 00:26   EKG:  Orders placed or performed during the hospital encounter of 05/06/16  . ED EKG  . ED EKG  . EKG 12-Lead  . EKG 12-Lead  . EKG 12-Lead  . EKG 12-Lead    ASSESSMENT AND PLAN:  Active Problems:   Altered mental status #1. Diarrhea, unclear etiology, stool cultures, including C. Difficile are not obtained, contact precautions if diarrhea recurs, discontinue IV fluids, as oral intake is satisfactory #2. Hypokalemia, magnesium level was normal, potassium was supplemented orally and intravenously, level is normal today  #3. Acute Renal insufficiency, resolved with IV fluid administration, bladder scan was normal #4 . Confusion in patient with history of bipolar disorder, now resumed Seroquel, some improved clinically, getting psychiatrist involved for recommendations, getting brain MRI to rule out stroke #5. Essential hypertension, blood  pressure is fluctuating between 150  and 90 systolic, discontinue IV fluids and follow blood pressure readings closely, initiate medications as needed #6. Tobacco abuse. Counseling, discussed this patient for approximately 3 minutes yesterday, nicotine replacement therapy was initiated  Management plans discussed with the patient, family and they are in agreement.   DRUG ALLERGIES: No Known Allergies  CODE STATUS:     Code Status Orders        Start     Ordered   05/07/16 0522  Full code  Continuous     05/07/16 0521    Code Status History    Date Active Date Inactive Code Status Order ID Comments User Context   This patient has a current code status but no historical code status.      TOTAL TIME TAKING CARE OF THIS PATIENT: 35 minutes.   Discussed with Dr. Tomie China M.D on 05/08/2016 at 11:57 AM  Between 7am to 6pm - Pager - [phone]  After 6pm go to [email or web address] - password [email or web address]  Office  [phone]  CC: Primary care physician; Petra Kuba, MD

## 2016-05-08 NOTE — Care Management Obs Status (Signed)
[facility] NOTIFICATION   Patient Details  Name: Jonathan Howell MRN: [id number] Date of Birth: [birth year]   Medicare Observation Status Notification Given:  Yes  code 49 per medicare [facility].  spoke with patient's daughter and provided CM contact if any questions.    Katrina Stack, RN 05/08/2016, 8:50 AM

## 2016-05-08 NOTE — Care Management (Signed)
Patient is able to state he is in [facility], it is Wednesday and it is July.  Discussed the need to mobilize patient during progression.  Neuro following.  Reason for confusion is not clear but is improving since seroquel restarted.  If able to return home may benefit from home health nurse

## 2016-05-08 NOTE — Consult Note (Signed)
[facility] Face-to-Face Psychiatry Consult   Reason for Consult:  Consult for this 75 year old man brought into the hospital with altered mental status Referring Physician:  Ether Griffins Patient Identification: Jonathan Howell MRN:  [id number] Principal Diagnosis: Acute delirium Diagnosis:   Patient Active Problem List   Diagnosis Date Noted  . Acute delirium [R41.0] 05/08/2016  . Altered mental status [R41.82] 05/07/2016  . Personal history of tobacco use, presenting hazards to health [Z87.891] 07/14/2015    Total Time spent with patient: 45 minutes  Subjective:   Jonathan Howell is a 75 y.o. male patient admitted with "I was confused".  HPI:  Patient interviewed. I also got to speak to his daughter. Chart reviewed. Labs reviewed including today's MRI scan. This 75 year old man was brought to the hospital a couple days ago with new onset confusion. The patient's daughter states that on Monday he woke up much earlier in the day than he usually does. Usually he sleeps until after 6 PM but on that day he was awake by 4:00. She says that he told her that he needed to be taken to the hospital because he was afraid he was going to die at home but wouldn't specify any other specific symptom. The patient tells me that he remembers being confused. He remembers having felt run down and not very good for a short period of time before that but denies any other specific physical symptoms. He denies any psychiatric symptoms. He denies having been depressed. He denies any thought disorder including any hallucinations. Denies any suicidal or homicidal ideation. He denies being aware of any kind of new social stress. Daughter also is not aware of any kind of new social stressor. Neither of them are aware of any change that it happened to any of his medicine recently or new medical problems. Patient has not been drinking or abusing any drugs. He was quite confused and very hard to talk to when he first came into the hospital but  it sounds like he is improving quite a bit. On interview today he says he is feeling back to his normal self. He is alert and oriented to his situation. He still clearly has some memory problems. He could not consistently tell me the correct year and he could only remember 1 out of 3 objects after a short rake and had a few other cognitive impairments on testing.  Medical history: Hypertension.  Substance abuse history: Patient had an alcohol problem but stopped drinking over 20 years ago. No return to drinking or drug abuse.  Social history: Lives with his daughter. Not sure who else might be at home. They both seem to find it a comfortable and stable situation.  Past Psychiatric History: Both the patient and the daughter give the same history which is that he had a very difficult time going through alcohol withdrawal about 20 years ago and at that time he was started on the psychiatric medicines he currently takes which are Seroquel and Prozac. Since then he has simply continued on them. He denies knowing of any other psychiatric medicines that he had ever been prescribed. Denies ever being in a psychiatric hospital. Denies any history of suicide attempts or violence. No history of psychotic disorder or mania.  Risk to Self: Is patient at risk for suicide?: No Risk to Others:   Prior Inpatient Therapy:   Prior Outpatient Therapy:    Past Medical History:  Past Medical History:  Diagnosis Date  . COPD (chronic obstructive pulmonary disease) ([place])   .  Hypertension   . Personal history of tobacco use, presenting hazards to health 07/14/2015   History reviewed. No pertinent surgical history. Family History: History reviewed. No pertinent family history. Family Psychiatric  History: No known family history of mental health problems Social History:  History  Alcohol Use No     History  Drug Use No    Social History   Social History  . Marital status: Widowed    Spouse name: N/A  .  Number of children: N/A  . Years of education: N/A   Social History Main Topics  . Smoking status: Heavy Tobacco Smoker  . Smokeless tobacco: Never Used  . Alcohol use No  . Drug use: No  . Sexual activity: Yes    Birth control/ protection: None   Other Topics Concern  . None   Social History Narrative  . None   Additional Social History:    Allergies:  No Known Allergies  Labs:  Results for orders placed or performed during the hospital encounter of 05/06/16 (from the past 48 hour(s))  Lipase, blood     Status: None   Collection Time: 05/06/16  7:27 PM  Result Value Ref Range   Lipase 34 11 - 51 U/L  Comprehensive metabolic panel     Status: Abnormal   Collection Time: 05/06/16  7:27 PM  Result Value Ref Range   Sodium 136 135 - 145 mmol/L   Potassium 3.5 3.5 - 5.1 mmol/L   Chloride 104 101 - 111 mmol/L   CO2 24 22 - 32 mmol/L   Glucose, Bld 91 65 - 99 mg/dL   BUN 18 6 - 20 mg/dL   Creatinine, Ser 1.39 (H) 0.61 - 1.24 mg/dL   Calcium 8.4 (L) 8.9 - 10.3 mg/dL   Total Protein 7.5 6.5 - 8.1 g/dL   Albumin 3.9 3.5 - 5.0 g/dL   AST 18 15 - 41 U/L   ALT 11 (L) 17 - 63 U/L   Alkaline Phosphatase 61 38 - 126 U/L   Total Bilirubin 0.3 0.3 - 1.2 mg/dL   GFR calc non Af Amer 48 (L) >60 mL/min   GFR calc Af Amer 56 (L) >60 mL/min    Comment: (NOTE) The eGFR has been calculated using the CKD EPI equation. This calculation has not been validated in all clinical situations. eGFR's persistently <60 mL/min signify possible Chronic Kidney Disease.    Anion gap 8 5 - 15  CBC     Status: None   Collection Time: 05/06/16  7:27 PM  Result Value Ref Range   WBC 8.0 3.8 - 10.6 K/uL   RBC 4.93 4.40 - 5.90 MIL/uL   Hemoglobin 14.3 13.0 - 18.0 g/dL   HCT 42.6 40.0 - 52.0 %   MCV 86.5 80.0 - 100.0 fL   MCH 29.1 26.0 - 34.0 pg   MCHC 33.7 32.0 - 36.0 g/dL   RDW 14.4 11.5 - 14.5 %   Platelets 302 150 - 440 K/uL  Ethanol     Status: None   Collection Time: 05/06/16  7:27 PM   Result Value Ref Range   Alcohol, Ethyl (B) <5 <5 mg/dL    Comment:        LOWEST DETECTABLE LIMIT FOR SERUM ALCOHOL IS 5 mg/dL FOR MEDICAL PURPOSES ONLY   Troponin I     Status: None   Collection Time: 05/06/16  7:27 PM  Result Value Ref Range   Troponin I <0.03 <0.03 ng/mL  Urinalysis complete, with  microscopic     Status: Abnormal   Collection Time: 05/06/16  8:40 PM  Result Value Ref Range   Color, Urine YELLOW (A) YELLOW   APPearance CLEAR (A) CLEAR   Glucose, UA NEGATIVE NEGATIVE mg/dL   Bilirubin Urine NEGATIVE NEGATIVE   Ketones, ur TRACE (A) NEGATIVE mg/dL   Specific Gravity, Urine 1.016 1.005 - 1.030   Hgb urine dipstick NEGATIVE NEGATIVE   pH 6.0 5.0 - 8.0   Protein, ur 30 (A) NEGATIVE mg/dL   Nitrite NEGATIVE NEGATIVE   Leukocytes, UA NEGATIVE NEGATIVE   RBC / HPF 0-5 0 - 5 RBC/hpf   WBC, UA 0-5 0 - 5 WBC/hpf   Bacteria, UA RARE (A) NONE SEEN   Squamous Epithelial / LPF NONE SEEN NONE SEEN   Mucous PRESENT    Hyaline Casts, UA PRESENT   Ammonia     Status: None   Collection Time: 05/06/16  8:41 PM  Result Value Ref Range   Ammonia 19 9 - 35 umol/L  Urine Drug Screen, Qualitative ([facility] only)     Status: None   Collection Time: 05/06/16  8:41 PM  Result Value Ref Range   Tricyclic, Ur Screen NONE DETECTED NONE DETECTED   Amphetamines, Ur Screen NONE DETECTED NONE DETECTED   MDMA (Ecstasy)Ur Screen NONE DETECTED NONE DETECTED   Cocaine Metabolite,Ur [place] NONE DETECTED NONE DETECTED   Opiate, Ur Screen NONE DETECTED NONE DETECTED   Phencyclidine (PCP) Ur S NONE DETECTED NONE DETECTED   Cannabinoid 50 Ng, Ur [place] NONE DETECTED NONE DETECTED   Barbiturates, Ur Screen NONE DETECTED NONE DETECTED   Benzodiazepine, Ur Scrn NONE DETECTED NONE DETECTED   Methadone Scn, Ur NONE DETECTED NONE DETECTED    Comment: (NOTE) 557  Tricyclics, urine               Cutoff 1000 ng/mL 200  Amphetamines, urine             Cutoff 1000 ng/mL 300  MDMA (Ecstasy), urine            Cutoff 500 ng/mL 400  Cocaine Metabolite, urine       Cutoff 300 ng/mL 500  Opiate, urine                   Cutoff 300 ng/mL 600  Phencyclidine (PCP), urine      Cutoff 25 ng/mL 700  Cannabinoid, urine              Cutoff 50 ng/mL 800  Barbiturates, urine             Cutoff 200 ng/mL 900  Benzodiazepine, urine           Cutoff 200 ng/mL 1000 Methadone, urine                Cutoff 300 ng/mL 1100 1200 The urine drug screen provides only a preliminary, unconfirmed 1300 analytical test result and should not be used for non-medical 1400 purposes. Clinical consideration and professional judgment should 1500 be applied to any positive drug screen result due to possible 1600 interfering substances. A more specific alternate chemical method 1700 must be used in order to obtain a confirmed analytical result.  1800 Gas chromato graphy / mass spectrometry (GC/MS) is the preferred 1900 confirmatory method.   Urine culture     Status: None   Collection Time: 05/06/16  8:41 PM  Result Value Ref Range   Specimen Description URINE, CLEAN CATCH    Special Requests NONE  Culture NO GROWTH Performed at [facility]     Report Status 05/08/2016 FINAL   TSH     Status: None   Collection Time: 05/07/16  6:08 AM  Result Value Ref Range   TSH 1.162 0.350 - 4.500 uIU/mL  Troponin I     Status: None   Collection Time: 05/07/16  6:08 AM  Result Value Ref Range   Troponin I <0.03 <0.03 ng/mL  Hemoglobin A1c     Status: None   Collection Time: 05/07/16  6:08 AM  Result Value Ref Range   Hgb A1c MFr Bld 5.5 4.0 - 6.0 %  Comprehensive metabolic panel     Status: Abnormal   Collection Time: 05/07/16  6:08 AM  Result Value Ref Range   Sodium 135 135 - 145 mmol/L   Potassium 2.9 (L) 3.5 - 5.1 mmol/L   Chloride 102 101 - 111 mmol/L   CO2 22 22 - 32 mmol/L   Glucose, Bld 121 (H) 65 - 99 mg/dL   BUN 21 (H) 6 - 20 mg/dL   Creatinine, Ser 1.26 (H) 0.61 - 1.24 mg/dL   Calcium 8.6 (L) 8.9 - 10.3  mg/dL   Total Protein 7.3 6.5 - 8.1 g/dL   Albumin 3.7 3.5 - 5.0 g/dL   AST 16 15 - 41 U/L   ALT 10 (L) 17 - 63 U/L   Alkaline Phosphatase 58 38 - 126 U/L   Total Bilirubin 1.0 0.3 - 1.2 mg/dL   GFR calc non Af Amer 54 (L) >60 mL/min   GFR calc Af Amer >60 >60 mL/min    Comment: (NOTE) The eGFR has been calculated using the CKD EPI equation. This calculation has not been validated in all clinical situations. eGFR's persistently <60 mL/min signify possible Chronic Kidney Disease.    Anion gap 11 5 - 15  CBC     Status: Abnormal   Collection Time: 05/07/16  6:08 AM  Result Value Ref Range   WBC 9.7 3.8 - 10.6 K/uL   RBC 4.91 4.40 - 5.90 MIL/uL   Hemoglobin 14.6 13.0 - 18.0 g/dL   HCT 42.4 40.0 - 52.0 %   MCV 86.4 80.0 - 100.0 fL   MCH 29.7 26.0 - 34.0 pg   MCHC 34.3 32.0 - 36.0 g/dL   RDW 14.6 (H) 11.5 - 14.5 %   Platelets 304 150 - 440 K/uL  Occult blood card to lab, stool     Status: None   Collection Time: 05/07/16  9:00 AM  Result Value Ref Range   Fecal Occult Bld NEGATIVE NEGATIVE  Troponin I     Status: None   Collection Time: 05/07/16 12:32 PM  Result Value Ref Range   Troponin I <0.03 <0.03 ng/mL  Magnesium     Status: None   Collection Time: 05/07/16 12:32 PM  Result Value Ref Range   Magnesium 1.9 1.7 - 2.4 mg/dL  Vitamin B12     Status: None   Collection Time: 05/07/16 12:32 PM  Result Value Ref Range   Vitamin B-12 197 180 - 914 pg/mL    Comment: (NOTE) This assay is not validated for testing neonatal or myeloproliferative syndrome specimens for Vitamin B12 levels. Performed at [facility]   Sedimentation rate     Status: Abnormal   Collection Time: 05/07/16 12:32 PM  Result Value Ref Range   Sed Rate 29 (H) 0 - 20 mm/hr  Folate     Status: None   Collection  Time: 05/07/16 12:32 PM  Result Value Ref Range   Folate 6.6 >5.9 ng/mL  Potassium     Status: None   Collection Time: 05/07/16  3:07 PM  Result Value Ref Range   Potassium 4.3 3.5 -  5.1 mmol/L  Troponin I     Status: None   Collection Time: 05/07/16  5:27 PM  Result Value Ref Range   Troponin I <0.03 <0.03 ng/mL  Basic metabolic panel     Status: Abnormal   Collection Time: 05/08/16  3:43 AM  Result Value Ref Range   Sodium 133 (L) 135 - 145 mmol/L   Potassium 4.0 3.5 - 5.1 mmol/L   Chloride 106 101 - 111 mmol/L   CO2 21 (L) 22 - 32 mmol/L   Glucose, Bld 83 65 - 99 mg/dL   BUN 20 6 - 20 mg/dL   Creatinine, Ser 1.16 0.61 - 1.24 mg/dL   Calcium 8.4 (L) 8.9 - 10.3 mg/dL   GFR calc non Af Amer >60 >60 mL/min   GFR calc Af Amer >60 >60 mL/min    Comment: (NOTE) The eGFR has been calculated using the CKD EPI equation. This calculation has not been validated in all clinical situations. eGFR's persistently <60 mL/min signify possible Chronic Kidney Disease.    Anion gap 6 5 - 15    Current Facility-Administered Medications  Medication Dose Route Frequency Provider Last Rate Last Dose  . 0.9 %  sodium chloride infusion  250 mL Intravenous PRN Alexis Hugelmeyer, DO      . acetaminophen (TYLENOL) tablet 650 mg  650 mg Oral Q6H PRN Alexis Hugelmeyer, DO       Or  . acetaminophen (TYLENOL) suppository 650 mg  650 mg Rectal Q6H PRN Alexis Hugelmeyer, DO      . aspirin EC tablet 81 mg  81 mg Oral Daily Alexis Hugelmeyer, DO   81 mg at 05/08/16 0854  . enoxaparin (LOVENOX) injection 40 mg  40 mg Subcutaneous Q24H Alexis Hugelmeyer, DO   40 mg at 05/07/16 2113  . FLUoxetine (PROZAC) capsule 40 mg  40 mg Oral Daily Alexis Hugelmeyer, DO   40 mg at 05/08/16 0854  . hydrALAZINE (APRESOLINE) injection 10 mg  10 mg Intravenous Q4H PRN Saundra Shelling, MD   10 mg at 05/08/16 0045  . nicotine (NICODERM CQ - dosed in mg/24 hr) patch 7 mg  7 mg Transdermal Daily Theodoro Grist, MD   7 mg at 05/08/16 0854  . ondansetron (ZOFRAN) tablet 4 mg  4 mg Oral Q6H PRN Alexis Hugelmeyer, DO       Or  . ondansetron (ZOFRAN) injection 4 mg  4 mg Intravenous Q6H PRN Alexis Hugelmeyer, DO      .  QUEtiapine (SEROQUEL) tablet 400 mg  400 mg Oral QHS Alexis Hugelmeyer, DO   400 mg at 05/07/16 2113  . sodium chloride flush (NS) 0.9 % injection 3 mL  3 mL Intravenous Q12H Alexis Hugelmeyer, DO      . sodium chloride flush (NS) 0.9 % injection 3 mL  3 mL Intravenous Q12H Alexis Hugelmeyer, DO   3 mL at 05/08/16 0855  . sodium chloride flush (NS) 0.9 % injection 3 mL  3 mL Intravenous PRN Alexis Hugelmeyer, DO        Musculoskeletal: Strength & Muscle Tone: within normal limits Gait & Station: unsteady Patient leans: N/A  Psychiatric Specialty Exam: Physical Exam  Constitutional: He appears well-developed and well-nourished.  HENT:  Head: Normocephalic and atraumatic.  Eyes: Conjunctivae are normal. Pupils are equal, round, and reactive to light.  Neck: Normal range of motion.  Cardiovascular: Normal rate and regular rhythm.   Respiratory: Effort normal.  GI: Soft.  Musculoskeletal: Normal range of motion.  Neurological: He is alert.  Skin: Skin is warm and dry.  Psychiatric: He has a normal mood and affect. His speech is normal. Judgment normal. He is slowed. He exhibits abnormal recent memory.    Review of Systems  Constitutional: Negative.   HENT: Negative.   Eyes: Negative.   Respiratory: Negative.   Cardiovascular: Negative.   Gastrointestinal: Negative.   Musculoskeletal: Negative.   Skin: Negative.   Neurological: Negative.   Psychiatric/Behavioral: Positive for memory loss. Negative for depression, hallucinations, substance abuse and suicidal ideas. The patient is not nervous/anxious and does not have insomnia.     Blood pressure (!) 147/94, pulse (!) 102, temperature 98.2 F (36.8 C), temperature source Oral, resp. rate 18, height 5' 2"  (1.575 m), weight 65.3 kg (144 lb), SpO2 94 %.Body mass index is 26.34 kg/m.  General Appearance: Fairly Groomed  Eye Contact:  Good  Speech:  Slow  Volume:  Decreased  Mood:  Euthymic  Affect:  Appropriate  Thought Process:   Disorganized  Orientation:  Other:  He knew where he was and basically why he was here. He knew the correct month but he could not give the date of the year correct despite multiple attempts.  Thought Content:  Negative  Suicidal Thoughts:  No  Homicidal Thoughts:  No  Memory:  Immediate;   Good Recent;   Poor Remote;   Fair  Judgement:  Fair  Insight:  Fair  Psychomotor Activity:  Decreased  Concentration:  Concentration: Fair  Recall:  [employer] of Knowledge:  Fair  Language:  Fair  Akathisia:  No  Handed:  Right  AIMS (if indicated):     Assets:  Communication Skills Desire for Improvement Financial Resources/Insurance Housing Social Support  ADL's:  Intact  Cognition:  Impaired,  Mild  Sleep:        Treatment Plan Summary: Plan 75 year old man without any clear past psychiatric history although he has been maintained on Seroquel and Prozac for years. Presented with an acute delirium. So far workup has not shown a definite specific cause. Everything about the history however sounds more like an organic problem than a psychiatric one. This is not a patient who has ever had mental health decompensations in the past, there was no known stressor, there does not seem to be a lot of emotion involved in the situation. He appears to be recovering fairly rapidly. Whatever the problem is he seems to be getting better at this point. He is comfortable staying on the Prozac and Seroquel so I will not change those doses. I will follow-up as needed.  Disposition: Patient does not meet criteria for psychiatric inpatient admission. Supportive therapy provided about ongoing stressors.  Alethia Berthold, MD 05/08/2016 4:54 PM

## 2016-05-08 NOTE — Progress Notes (Signed)
Subjective: Patient improved.  Unclear what baseline is.  Objective: Current vital signs: BP 100/62   Pulse (!) 112   Temp 98.4 F (36.9 C) (Oral)   Resp 18   Ht 5' 2"  (1.575 m)   Wt 65.3 kg (144 lb)   SpO2 94%   BMI 26.34 kg/m  Vital signs in last 24 hours: Temp:  [98.4 F (36.9 C)-98.9 F (37.2 C)] 98.4 F (36.9 C) (07/26 0408) Pulse Rate:  [86-112] 112 (07/26 0715) Resp:  [16-20] 18 (07/26 0715) BP: (90-194)/(58-176) 100/62 (07/26 0715) SpO2:  [92 %-98 %] 94 % (07/26 0715)  Intake/Output from previous day: 07/25 0701 - 07/26 0700 In: 120 [P.O.:120] Out: 700 [Urine:700] Intake/Output this shift: Total I/O In: 240 [P.O.:240] Out: 0  Nutritional status: Diet Heart Room service appropriate? Yes; Fluid consistency: Thin  Neurologic Exam: Mental Status: Alert.  Patient oriented to name, age and birth date.  Knows the month and year and who he lives with.  Thinks the President is Doctor, general practice.  At times responses are inappropriate.  Reports he lost 2 units of blood overnight.  Speech fluent without evidence of aphasia.  Able to follow 3 step commands without difficulty. Cranial Nerves: II: Discs flat bilaterally; Visual fields grossly normal, pupils equal, round, reactive to light and accommodation III,IV, VI: ptosis not present, extra-ocular motions intact bilaterally V,VII: smile symmetric, facial light touch sensation normal bilaterally VIII: hearing normal bilaterally IX,X: gag reflex present XI: bilateral shoulder shrug XII: midline tongue extension Motor: Right :            Upper extremity   5/5                                                                              Left:     Upper extremity   5/5                       Lower extremity   5/5                                                                                                    Lower extremity   5/5 Tone and bulk:normal tone throughout; no atrophy noted.  Lab Results: Basic Metabolic Panel:  Recent  Labs Lab 05/06/16 1927 05/07/16 0608 05/07/16 1232 05/07/16 1507 05/08/16 0343  NA 136 135  --   --  133*  K 3.5 2.9*  --  4.3 4.0  CL 104 102  --   --  106  CO2 24 22  --   --  21*  GLUCOSE 91 121*  --   --  83  BUN 18 21*  --   --  20  CREATININE 1.39* 1.26*  --   --  1.16  CALCIUM  8.4* 8.6*  --   --  8.4*  MG  --   --  1.9  --   --     Liver Function Tests:  Recent Labs Lab 05/06/16 1927 05/07/16 0608  AST 18 16  ALT 11* 10*  ALKPHOS 61 58  BILITOT 0.3 1.0  PROT 7.5 7.3  ALBUMIN 3.9 3.7    Recent Labs Lab 05/06/16 1927  LIPASE 34    Recent Labs Lab 05/06/16 2041  AMMONIA 19    CBC:  Recent Labs Lab 05/06/16 1927 05/07/16 0608  WBC 8.0 9.7  HGB 14.3 14.6  HCT 42.6 42.4  MCV 86.5 86.4  PLT 302 304    Cardiac Enzymes:  Recent Labs Lab 05/06/16 1927 05/07/16 0608 05/07/16 1232 05/07/16 1727  TROPONINI <0.03 <0.03 <0.03 <0.03    Lipid Panel: No results for input(s): CHOL, TRIG, HDL, CHOLHDL, VLDL, LDLCALC in the last 168 hours.  CBG: No results for input(s): GLUCAP in the last 168 hours.  Microbiology: Results for orders placed or performed during the hospital encounter of 02/09/15  Urine culture     Status: None   Collection Time: 02/09/15  1:26 PM  Result Value Ref Range Status   Micro Text Report   Final       SOURCE: CLEAN CATCH    COMMENT                   MIXED BACTERIAL ORGANISMS   COMMENT                   RESULTS SUGGESTIVE OF CONTAMINATION   ANTIBIOTIC                                                        Coagulation Studies: No results for input(s): LABPROT, INR in the last 72 hours.  Imaging: Dg Chest 2 View  Result Date: 05/06/2016 CLINICAL DATA:  Per pt family, pt has been having cp, abdominal pain, weakness and AMS today. Pt hx of asthma, COPD, emphysema per family members. Pt was a smoker. EXAM: CHEST  2 VIEW COMPARISON:  Chest CT, 02/28/2015.  Chest radiograph, 01/30/2015. FINDINGS: Cardiac silhouette is  normal in size and configuration. The aorta is mildly uncoiled. No mediastinal or hilar masses or convincing adenopathy. Lungs are hyperexpanded. Irregular interstitial thickening in the lung bases consistent with fibrosis is stable. There are advanced changes of emphysema, also stable. No evidence of pneumonia or pulmonary edema. No pleural effusion or pneumothorax. Bony thorax is demineralized. There arthropathic changes of both shoulders, stable. IMPRESSION: 1. No acute cardiopulmonary disease. 2. 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 Electronically Signed   By: Lajean Manes M.D.   On: 05/06/2016 20:38  Dg Abd 1 View  Result Date: 05/07/2016 CLINICAL DATA:  Rectal bleeding with diarrhea. EXAM: ABDOMEN - 1 VIEW COMPARISON:  None. FINDINGS: Diffuse gaseous distention of large and small bowel not. Degenerative changes noted in the lumbar spine diffusely. Patient is status post bilateral hip replacement. IMPRESSION: Diffuse gaseous bowel distention. Component of underlying ileus suspected. Electronically Signed   By: Misty Stanley M.D.   On: 05/07/2016 15:55  Ct Head Wo Contrast  Result Date: 05/07/2016 CLINICAL DATA:  75 year old male with lightheadedness and vomiting EXAM: CT HEAD WITHOUT CONTRAST TECHNIQUE: Contiguous axial  images were obtained from the base of the skull through the vertex without intravenous contrast. COMPARISON:  None. FINDINGS: There is mild dilatation of the ventricles out of proportion with the sulci which may represent central volume loss versus normal pressure hydrocephalus. Clinical correlation is recommended. Check mild periventricular and deep white matter chronic microvascular ischemic changes noted. There is no acute intracranial hemorrhage. No mass effect or midline shift noted. There is opacification of the right frontal sinus and multiple right ethmoid air cells. No air-fluid levels. The mastoid air cells are clear. The calvarium is intact.  IMPRESSION: No acute intracranial hemorrhage. Mild age-related atrophy and chronic microvascular ischemic disease. If symptoms persist and there are no contraindications, MRI may provide better evaluation if clinically indicated Electronically Signed   By: Anner Crete M.D.   On: 05/07/2016 00:19  US Carotid Bilateral  Result Date: 05/07/2016 CLINICAL DATA:  Altered mental status, syncope, hyperlipidemia, tobacco use EXAM: BILATERAL CAROTID DUPLEX ULTRASOUND TECHNIQUE: Pearline Cables scale imaging, color Doppler and duplex ultrasound were performed of bilateral carotid and vertebral arteries in the neck. COMPARISON:  Unavailable FINDINGS: Criteria: Quantification of carotid stenosis is based on velocity parameters that correlate the residual internal carotid diameter with NASCET-based stenosis levels, using the diameter of the distal internal carotid lumen as the denominator for stenosis measurement. The following velocity measurements were obtained: RIGHT ICA:  47/17 cm/sec CCA:  97/35 cm/sec SYSTOLIC ICA/CCA RATIO:  0.6 DIASTOLIC ICA/CCA RATIO:  0.9 ECA:  87 cm/sec LEFT ICA:  57/24 cm/sec CCA:  32/99 cm/sec SYSTOLIC ICA/CCA RATIO:  0.9 DIASTOLIC ICA/CCA RATIO:  1.8 ECA:  61 cm/sec RIGHT CAROTID ARTERY: Moderate heterogeneous partially calcified atherosclerosis. Despite this, there is no significant luminal narrowing, ICA stenosis, velocity elevation, or turbulent flow. Degree of stenosis less than 50%. RIGHT VERTEBRAL ARTERY:  Antegrade LEFT CAROTID ARTERY: Similar mild to moderate heterogeneous carotid atherosclerosis with partial calcification. Despite this, there is no hemodynamically significant ICA stenosis, velocity elevation, or turbulent flow. Degree of narrowing also less than 50%. LEFT VERTEBRAL ARTERY:  Antegrade IMPRESSION: Right greater the left moderate carotid atherosclerosis. No hemodynamically significant ICA stenosis. Degree of narrowing less than 50% bilaterally. Patent antegrade vertebral flow  bilaterally Electronically Signed   By: Jerilynn Mages.  Shick M.D.   On: 05/07/2016 14:59  Ct Renal Stone Study  Result Date: 05/07/2016 CLINICAL DATA:  75 year old male with lightheadedness and vomiting EXAM: CT ABDOMEN AND PELVIS WITHOUT CONTRAST TECHNIQUE: Multidetector CT imaging of the abdomen and pelvis was performed following the standard protocol without IV contrast. COMPARISON:  Chest CT dated 08/17/2014 FINDINGS: Evaluation of this exam is limited in the absence of intravenous contrast. There are emphysematous changes of the visualized lung bases. A 7 mm subpleural nodule in the left lower low lobe (series 2 image 8) appears similar to study dated 08/17/2014. There is calcification of the mitral annulus. No intra-abdominal free air or free fluid. There is mild eventration of the right hemidiaphragm. There multiple stones within the gallbladder. No pericholecystic fluid or evidence of inflammation on CT. The liver, pancreas, spleen, adrenal glands appear unremarkable. There is a 10 mm stone in the left renal pelvis at the left ureteropelvic junction. There is no hydronephrosis. The right kidney appears unremarkable. An ill-defined subcentimeter hypodense lesion may be present in the upper superior pole of the right kidney. Ultrasound is recommended for further evaluation. The visualized ureters and urinary bladder appear unremarkable. The prostate and seminal vesicles are not well evaluated due to streak artifact caused by metallic hip  arthroplasties. There is no evidence of bowel obstruction or active inflammation. There scattered colonic diverticula without active inflammatory changes. Normal appendix. There is advanced aortoiliac atherosclerotic disease. 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 There is atherosclerotic calcification of the ostia of the renal arteries. No portal venous gas identified. There is no adenopathy. The abdominal wall soft tissues appear unremarkable. There is degenerative changes of  the spine with multilevel disc desiccation and vacuum phenomena with multilevel endplate irregularity noted. No acute fracture. IMPRESSION: A 1 cm left UPJ stone.  No hydronephrosis. Cholelithiasis. Colonic diverticulosis. No evidence of bowel obstruction or active inflammation. Normal appendix. Electronically Signed   By: Anner Crete M.D.   On: 05/07/2016 00:26   Medications:  I have reviewed the patient's current medications. Scheduled: . aspirin EC  81 mg Oral Daily  . enoxaparin (LOVENOX) injection  40 mg Subcutaneous Q24H  . FLUoxetine  40 mg Oral Daily  . nicotine  7 mg Transdermal Daily  . QUEtiapine  400 mg Oral QHS  . sodium chloride flush  3 mL Intravenous Q12H  . sodium chloride flush  3 mL Intravenous Q12H    Assessment/Plan: Although improved still not fully oriented.  Confabulatory.  Unclear baseline.  B12 197, ESR 29, Folate 6.6.  BP control improved.    Recommendations: 1.  B12 low normal.  Would supplement.     LOS: 1 day   Alexis Goodell, MD Neurology [phone] 05/08/2016  10:32 AM

## 2016-05-09 DIAGNOSIS — N201 Calculus of ureter: Secondary | ICD-10-CM

## 2016-05-09 DIAGNOSIS — Z716 Tobacco abuse counseling: Secondary | ICD-10-CM

## 2016-05-09 DIAGNOSIS — I10 Essential (primary) hypertension: Secondary | ICD-10-CM

## 2016-05-09 DIAGNOSIS — R197 Diarrhea, unspecified: Secondary | ICD-10-CM

## 2016-05-09 DIAGNOSIS — E871 Hypo-osmolality and hyponatremia: Secondary | ICD-10-CM

## 2016-05-09 DIAGNOSIS — G912 (Idiopathic) normal pressure hydrocephalus: Secondary | ICD-10-CM

## 2016-05-09 DIAGNOSIS — E876 Hypokalemia: Secondary | ICD-10-CM

## 2016-05-09 LAB — VITAMIN B1: VITAMIN B1 (THIAMINE): 94.5 nmol/L (ref 66.5–200.0)

## 2016-05-09 MED ORDER — NICOTINE 7 MG/24HR TD PT24: 7.0000 mg | MEDICATED_PATCH | Freq: Every day | TRANSDERMAL | 0 refills | Status: DC

## 2016-05-09 MED ORDER — LISINOPRIL 2.5 MG PO TABS: 2.5000 mg | ORAL_TABLET | Freq: Every day | ORAL | 5 refills | Status: DC

## 2016-05-09 MED ORDER — QUETIAPINE FUMARATE 400 MG PO TABS: 400.0000 mg | ORAL_TABLET | Freq: Every day | ORAL | 6 refills | Status: DC

## 2016-05-09 MED ORDER — LISINOPRIL 5 MG PO TABS: 2.5000 mg | ORAL_TABLET | Freq: Every day | ORAL | Status: DC

## 2016-05-09 NOTE — Progress Notes (Signed)
Patient is alert and oriented, vss, no complaints of pain.  Plan is to discharge later today.  Patient would like [place] to wait until family is here to go over discharge paperwork.

## 2016-05-09 NOTE — Discharge Summary (Signed)
[facility] NAME: Jonathan Howell    MR#:  [id number]  DATE OF BIRTH:  1941/06/18  DATE OF ADMISSION:  05/06/2016 ADMITTING PHYSICIAN: Max Sane, MD  DATE OF DISCHARGE: No discharge date for patient encounter.  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 COPD (chronic obstructive pulmonary disease) ([place])   . Hypertension   . Personal history of tobacco use, presenting hazards to health 07/14/2015    .pro HOSPITAL COURSE:  Patient is 75 year old African-American male with past medical history significant for history of essential hypertension, COPD, who presents with altered mental status and diarrhea, nausea. Apparently patient has been on Seroquel and Prozac for years, however, he was off Seroquel for the past 5 days, and became confused. He was admitted to the hospital for further evaluation and treatment. His labs revealed acute renal insufficiency with creatinine of 1.39, cardiac enzymes were negative 3. Folic acid level was normal at 6.6. Vitamin B1 normal, vitamin B12 low normal at 197, CBC within normal limits. Hemoglobin A1c was found to be 5.5. TSH normal at 1.16. Urinalysis was unremarkable. Patient chest x-ray revealed no acute cardiopulmonary disease, changes of COPD and pulmonary fibrosis, similar to prior studies, head CT was unremarkable,  but age-related atrophy. CT of abdomen without contrast revealed a 1 cm left UPJ stone, no hydronephrosis, cholelithiasis, colonic diverticulosis but no evidence of other abnormalities, normal appendix. Ultrasound of carotid arteries revealed right greater than left moderate carotid atherosclerosis, abdominal x-ray revealed diffuse gaseous bowel distention, possible underlying ileus.  MRI of brain was performed without contrast revealing ventricular enlargement out of proportion to the degree of cortical atrophy, correlate clinically for normal pressure hydrocephalus. Patient was admitted to the hospital for further evaluation and treatment, was consulted by neurologist, psychiatrist, urologist, Seroquel was restarted and patient's condition improved slowly. He was recommended to be seen by a urologist as outpatient in the office, follow-up appointment will be arranged upon discharge. Patient is to follow-up with his primary care physician for further recommendations. Discussion by problem: #1. Nausea, vomiting and diarrhea, resolved with conservative therapy, etiology is unclear, although viral gastroenteritis was implicated, since patient's stool cultures, including C. Difficile were ordered, but not obtained due to lack of specimen . Initially, patient was given IV fluids, however, his oral intake has improved, however, remains inconsistent from bites  To 100% of food. Hydration has improved, however, this is recommended to follow patient's oral intake closely. #2. Hypokalemia, magnesium level was normal, potassium was supplemented orally and intravenously, level normalized  #3. Acute Renal insufficiency, resolved with IV fluid administration, bladder scan was normal #4 . Acute delirium in patient with no history of psychiatric disease, per psychiatrist, now resumed Seroquel, , recommended to continue Seroquel and Prozac ,  brain MRI revealed no acute stroke, however, questionable normal pressure  hydrocephalus. Patient was seen by neurologist, no further recommendations were made #5. Essential hypertension, blood pressure was fluctuating between  150-170, initiate 2.5 mg of lisinopril daily, advance it as needed as outpatient #6. Tobacco abuse counseling, discussed this patient for approximately 3 minutes , nicotine replacement therapy to be continued #7. Left UPJ stone, 1 cm, nonobstructive with no  hydronephrosis, patient was seen by urologist, recommended to follow-up with Dr. Erlene Quan in the office. Appointment will be made upon discharge. Patient's urinalysis was unremarkable, and although patient complained of some dysuria symptoms, no fevers or white blood cell count elevation was noted, patient is not on antibiotics at present.   DISCHARGE CONDITIONS:   Stable  CONSULTS OBTAINED:  Treatment Team:  Alexis Goodell, MD Cleon Gustin, MD Gonzella Lex, MD  DRUG ALLERGIES:  No Known Allergies  DISCHARGE MEDICATIONS:   Current Discharge Medication List    START taking these medications   Details  nicotine (NICODERM CQ - DOSED IN MG/24 HR) 7 mg/24hr patch Place 1 patch (7 mg total) onto the skin daily. Qty: 28 patch, Refills: 0      CONTINUE these medications which have CHANGED   Details  QUEtiapine (SEROQUEL) 400 MG tablet Take 1 tablet (400 mg total) by mouth at bedtime. Qty: 30 tablet, Refills: 6      CONTINUE these medications which have NOT CHANGED   Details  FLUoxetine (PROZAC) 20 MG tablet Take 40 mg by mouth daily.         DISCHARGE INSTRUCTIONS:    The patient is to follow-up with primary care physician, urologist as outpatient  If you experience worsening of your admission symptoms, develop shortness of breath, life threatening emergency, suicidal or homicidal thoughts you must seek medical attention immediately by calling 911 or calling your MD immediately  if symptoms less severe.  You Must read complete instructions/literature along with all  the possible adverse reactions/side effects for all the Medicines you take and that have been prescribed to you. Take any new Medicines after you have completely understood and accept all the possible adverse reactions/side effects.   Please note  You were cared for by a hospitalist during your hospital stay. If you have any questions about your discharge medications or the care you received while you were in the hospital after you are discharged, you can call the unit and asked to speak with the hospitalist on call if the hospitalist that took care of you is not available. Once you are discharged, your primary care physician will handle any further medical issues. Please note that NO REFILLS for any discharge medications will be authorized once you are discharged, as it is imperative that you return to your primary care physician (or establish a relationship with a primary care physician if you do not have one) for your aftercare needs so that they can reassess your need for medications and monitor your lab values.    Today   CHIEF COMPLAINT:   Chief Complaint  Patient presents with  . Abdominal Pain  . Diarrhea    HISTORY OF PRESENT ILLNESS:  Jonathan Howell  is a 75 y.o. male with a known history of essential hypertension, COPD, who presents with altered mental status and diarrhea, nausea. Apparently patient has been on Seroquel and Prozac for years, however, he was off Seroquel for the past 5 days, and became confused. He was admitted to the hospital for further evaluation and treatment. His labs revealed acute renal insufficiency with creatinine of 1.39, cardiac enzymes were negative 3. Folic acid level was normal at 6.6. Vitamin B1 normal, vitamin B12 low normal at  197, CBC within normal limits. Hemoglobin A1c was found to be 5.5. TSH normal at 1.16. Urinalysis was unremarkable. Patient chest x-ray revealed no acute cardiopulmonary disease, changes of COPD and pulmonary fibrosis, similar to  prior studies, head CT was unremarkable, but age-related atrophy. CT of abdomen without contrast revealed a 1 cm left UPJ stone, no hydronephrosis, cholelithiasis, colonic diverticulosis but no evidence of other abnormalities, normal appendix. Ultrasound of carotid arteries revealed right greater than left moderate carotid atherosclerosis, abdominal x-ray revealed diffuse gaseous bowel distention, possible underlying ileus.  MRI of brain was performed without contrast revealing ventricular enlargement out of proportion to the degree of cortical atrophy, correlate clinically for normal pressure hydrocephalus. Patient was admitted to the hospital for further evaluation and treatment, was consulted by neurologist, psychiatrist, urologist, Seroquel was restarted and patient's condition improved slowly. He was recommended to be seen by a urologist as outpatient in the office, follow-up appointment will be arranged upon discharge. Patient is to follow-up with his primary care physician for further recommendations. Discussion by problem: #1. Nausea, vomiting and diarrhea, resolved with conservative therapy, etiology is unclear, although viral gastroenteritis was implicated, since patient's stool cultures, including C. Difficile were ordered, but not obtained due to lack of specimen . Initially, patient was given IV fluids, however, his oral intake has improved, however, remains inconsistent from bites  To 100% of food. Hydration has improved, however, this is recommended to follow patient's oral intake closely. #2. Hypokalemia, magnesium level was normal, potassium was supplemented orally and intravenously, level normalized  #3. Acute Renal insufficiency, resolved with IV fluid administration, bladder scan was normal #4 . Acute delirium in patient with no history of psychiatric disease, per psychiatrist, now resumed Seroquel, , recommended to continue Seroquel and Prozac ,  brain MRI revealed no acute stroke, however,  questionable normal pressure hydrocephalus. Patient was seen by neurologist, no further recommendations were made #5. Essential hypertension, blood pressure was fluctuating between 150-170, initiate 2.5 mg of lisinopril daily, advance it as needed as outpatient #6. Tobacco abuse counseling, discussed this patient for approximately 3 minutes , nicotine replacement therapy to be continued #7. Left UPJ stone, 1 cm, nonobstructive with no  hydronephrosis, patient was seen by urologist, recommended to follow-up with Dr. Erlene Quan in the office. Appointment will be made upon discharge. Patient's urinalysis was unremarkable, and although patient complained of some dysuria symptoms, no fevers or white blood cell count elevation was noted, patient is not on antibiotics at present.      VITAL SIGNS:  Blood pressure 113/68, pulse 97, temperature 97.9 F (36.6 C), resp. rate 16, height '5\' 2"'$  (1.575 m), weight 65.3 kg (144 lb), SpO2 95 %.  I/O:   Intake/Output Summary (Last 24 hours) at 05/09/16 1110 Last data filed at 05/09/16 0915  Gross per 24 hour  Intake                3 ml  Output              300 ml  Net             -297 ml    PHYSICAL EXAMINATION:  GENERAL:  75 y.o.-year-old patient lying in the bed with no acute distress.  EYES: Pupils equal, round, reactive to light and accommodation. No scleral icterus. Extraocular muscles intact.  HEENT: Head atraumatic, normocephalic. Oropharynx and nasopharynx clear.  NECK:  Supple, no jugular venous distention. No thyroid enlargement, no tenderness.  LUNGS: Normal breath sounds bilaterally, no wheezing, rales,rhonchi or  crepitation. No use of accessory muscles of respiration.  CARDIOVASCULAR: S1, S2 normal. No murmurs, rubs, or gallops.  ABDOMEN: Soft, non-tender, non-distended. Bowel sounds present. No organomegaly or mass.  EXTREMITIES: No pedal edema, cyanosis, or clubbing.  NEUROLOGIC: Cranial nerves II through XII are intact. Muscle strength 5/5  in all extremities. Sensation intact. Gait not checked.  PSYCHIATRIC: The patient is alert and oriented x 3.  SKIN: No obvious rash, lesion, or ulcer.   DATA REVIEW:   CBC  Recent Labs Lab 05/07/16 0608  WBC 9.7  HGB 14.6  HCT 42.4  PLT 304    Chemistries   Recent Labs Lab 05/07/16 0608 05/07/16 1232  05/08/16 0343  NA 135  --   --  133*  K 2.9*  --   < > 4.0  CL 102  --   --  106  CO2 22  --   --  21*  GLUCOSE 121*  --   --  83  BUN 21*  --   --  20  CREATININE 1.26*  --   --  1.16  CALCIUM 8.6*  --   --  8.4*  MG  --  1.9  --   --   AST 16  --   --   --   ALT 10*  --   --   --   ALKPHOS 58  --   --   --   BILITOT 1.0  --   --   --   < > = values in this interval not displayed.  Cardiac Enzymes  Recent Labs Lab 05/07/16 1727  TROPONINI <0.03    Microbiology Results  Results for orders placed or performed during the hospital encounter of 05/06/16  Urine culture     Status: None   Collection Time: 05/06/16  8:41 PM  Result Value Ref Range Status   Specimen Description URINE, CLEAN CATCH  Final   Special Requests NONE  Final   Culture NO GROWTH Performed at [facility]   Final   Report Status 05/08/2016 FINAL  Final    RADIOLOGY:  Dg Abd 1 View  Result Date: 05/07/2016 CLINICAL DATA:  Rectal bleeding with diarrhea. EXAM: ABDOMEN - 1 VIEW COMPARISON:  None. FINDINGS: Diffuse gaseous distention of large and small bowel not. Degenerative changes noted in the lumbar spine diffusely. Patient is status post bilateral hip replacement. IMPRESSION: Diffuse gaseous bowel distention. Component of underlying ileus suspected. Electronically Signed   By: Misty Stanley M.D.   On: 05/07/2016 15:55  Mr Brain Wo Contrast  Result Date: 05/08/2016 CLINICAL DATA:  Altered mental status. Elevated blood pressure on admission. Evaluate for hypertensive encephalopathy. EXAM: MRI HEAD WITHOUT CONTRAST TECHNIQUE: Multiplanar, multiecho pulse sequences of the brain and  surrounding structures were obtained without intravenous contrast. COMPARISON:  CT head 05/06/2016. FINDINGS: No restricted diffusion to suggest acute stroke. No acute hemorrhage, intra-axial mass lesion, or extra-axial fluid. Moderate ventriculomegaly, with rounded frontal and occipital horns, somewhat out of proportion to the degree of cortical volume loss. Deep white matter hyperintensity, predominantly periventricular, could represent transependymal absorption. Correlate clinically for normal pressure hydrocephalus. No cortical edema in the posterior parietal occipital lobes or cerebellum to suggest PRES. While there is confluent white matter hyperintensity most notable in the parieto-occipital white matter, this is not favored to represent vasogenic edema. Motion degraded sagittal images demonstrate moderate soft tissues surrounding the odontoid, extending into the ventral epidural space, resulting in apparent widening of the predental space 4-5  mm, with also soft tissue extending slightly along the ventral aspect of the clivus. Correlate clinically for pannus related to psoriatic or rheumatoid arthritis. Mild cervicomedullary compression not excluded. Flow voids are maintained. No chronic hemorrhage. RIGHT frontoethmoid sinus disease, likely chronic. No layering fluid in the sinuses or mastoids. Negative orbits. IMPRESSION: Ventricular enlargement out of proportion to the degree of cortical atrophy. Periventricular predominant T2 and FLAIR hyperintensities. Correlate clinically for normal pressure hydrocephalus. Motion degraded sagittal images demonstrating moderate soft tissues surrounding the odontoid. Apparent pannus extending into the ventral epidural space as well as widened predental space. Correlate clinically for psoriatic or rheumatoid arthritis. No acute intracranial findings are evident. Specifically, no features strongly suggestive of PRES are observed. Electronically Signed   By: Staci Righter  M.D.   On: 05/08/2016 15:53  US Carotid Bilateral  Result Date: 05/07/2016 CLINICAL DATA:  Altered mental status, syncope, hyperlipidemia, tobacco use EXAM: BILATERAL CAROTID DUPLEX ULTRASOUND TECHNIQUE: Pearline Cables scale imaging, color Doppler and duplex ultrasound were performed of bilateral carotid and vertebral arteries in the neck. COMPARISON:  Unavailable FINDINGS: Criteria: Quantification of carotid stenosis is based on velocity parameters that correlate the residual internal carotid diameter with NASCET-based stenosis levels, using the diameter of the distal internal carotid lumen as the denominator for stenosis measurement. The following velocity measurements were obtained: RIGHT ICA:  47/17 cm/sec CCA:  00/71 cm/sec SYSTOLIC ICA/CCA RATIO:  0.6 DIASTOLIC ICA/CCA RATIO:  0.9 ECA:  87 cm/sec LEFT ICA:  57/24 cm/sec CCA:  21/97 cm/sec SYSTOLIC ICA/CCA RATIO:  0.9 DIASTOLIC ICA/CCA RATIO:  1.8 ECA:  61 cm/sec RIGHT CAROTID ARTERY: Moderate heterogeneous partially calcified atherosclerosis. Despite this, there is no significant luminal narrowing, ICA stenosis, velocity elevation, or turbulent flow. Degree of stenosis less than 50%. RIGHT VERTEBRAL ARTERY:  Antegrade LEFT CAROTID ARTERY: Similar mild to moderate heterogeneous carotid atherosclerosis with partial calcification. Despite this, there is no hemodynamically significant ICA stenosis, velocity elevation, or turbulent flow. Degree of narrowing also less than 50%. LEFT VERTEBRAL ARTERY:  Antegrade IMPRESSION: Right greater the left moderate carotid atherosclerosis. No hemodynamically significant ICA stenosis. Degree of narrowing less than 50% bilaterally. Patent antegrade vertebral flow bilaterally Electronically Signed   By: Jerilynn Mages.  Shick M.D.   On: 05/07/2016 14:59   EKG:   Orders placed or performed during the hospital encounter of 05/06/16  . ED EKG  . ED EKG  . EKG 12-Lead  . EKG 12-Lead  . EKG 12-Lead  . EKG 12-Lead      Management plans  discussed with the patient, family and they are in agreement.  CODE STATUS:     Code Status Orders        Start     Ordered   05/07/16 0522  Full code  Continuous     05/07/16 0521    Code Status History    Date Active Date Inactive Code Status Order ID Comments User Context   This patient has a current code status but no historical code status.      TOTAL TIME TAKING CARE OF THIS PATIENT: 40 minutes.    Theodoro Grist M.D on 05/09/2016 at 11:10 AM  Between 7am to 6pm - Pager - [phone]  After 6pm go to [email or web address] - password [email or web address] [facility]  Office  [phone]  CC: Primary care physician; Petra Kuba, MD

## 2016-05-09 NOTE — Progress Notes (Signed)
Removed PIV and telemetry.  Alert and oriented, vss, no complaints of pain.  D/C instructions given to patient.  Family at bedside.  No questions at this time.

## 2016-05-09 NOTE — Care Management (Signed)
Patient is ambulatory per nursing.  Patient denies need for any home health nursing.  Family will transport home.

## 2016-05-11 ENCOUNTER — Emergency Department
Admission: EM | Admit: 2016-05-11 | Discharge: 2016-05-11 | Disposition: A | Discharge status: Home / Self Care | Payer: Medicare Other | Attending: Emergency Medicine | Admitting: Emergency Medicine

## 2016-05-11 DIAGNOSIS — F0390 Unspecified dementia without behavioral disturbance: Secondary | ICD-10-CM | POA: Diagnosis not present

## 2016-05-11 DIAGNOSIS — I10 Essential (primary) hypertension: Secondary | ICD-10-CM | POA: Insufficient documentation

## 2016-05-11 DIAGNOSIS — F17200 Nicotine dependence, unspecified, uncomplicated: Secondary | ICD-10-CM | POA: Insufficient documentation

## 2016-05-11 DIAGNOSIS — R4182 Altered mental status, unspecified: Secondary | ICD-10-CM | POA: Diagnosis present

## 2016-05-11 DIAGNOSIS — J449 Chronic obstructive pulmonary disease, unspecified: Secondary | ICD-10-CM | POA: Diagnosis not present

## 2016-05-11 LAB — COMPREHENSIVE METABOLIC PANEL
ALBUMIN: 3.5 g/dL (ref 3.5–5.0)
ALK PHOS: 64 U/L (ref 38–126)
ALT: 14 U/L — AB (ref 17–63)
ANION GAP: 9 (ref 5–15)
AST: 24 U/L (ref 15–41)
BUN: 36 mg/dL — ABNORMAL HIGH (ref 6–20)
CALCIUM: 8.8 mg/dL — AB (ref 8.9–10.3)
CHLORIDE: 104 mmol/L (ref 101–111)
CO2: 20 mmol/L — AB (ref 22–32)
CREATININE: 1.46 mg/dL — AB (ref 0.61–1.24)
GFR calc non Af Amer: 46 mL/min — ABNORMAL LOW (ref >60)
GFR, EST AFRICAN AMERICAN: 53 mL/min — AB (ref >60)
GLUCOSE: 101 mg/dL — AB (ref 65–99)
Potassium: 4 mmol/L (ref 3.5–5.1)
SODIUM: 133 mmol/L — AB (ref 135–145)
Total Bilirubin: 0.6 mg/dL (ref 0.3–1.2)
Total Protein: 7.3 g/dL (ref 6.5–8.1)

## 2016-05-11 LAB — URINALYSIS COMPLETE WITH MICROSCOPIC (ARMC ONLY)
Bacteria, UA: NONE SEEN
Bilirubin Urine: NEGATIVE
Glucose, UA: NEGATIVE mg/dL
HGB URINE DIPSTICK: NEGATIVE
Ketones, ur: NEGATIVE mg/dL
Leukocytes, UA: NEGATIVE
Nitrite: NEGATIVE
PH: 5 (ref 5.0–8.0)
PROTEIN: NEGATIVE mg/dL
RBC / HPF: NONE SEEN RBC/hpf (ref 0–5)
Specific Gravity, Urine: 1.015 (ref 1.005–1.030)

## 2016-05-11 LAB — CBC
HCT: 39 % — ABNORMAL LOW (ref 40.0–52.0)
HEMOGLOBIN: 13.2 g/dL (ref 13.0–18.0)
MCH: 29.5 pg (ref 26.0–34.0)
MCHC: 33.8 g/dL (ref 32.0–36.0)
MCV: 87.3 fL (ref 80.0–100.0)
Platelets: 315 10*3/uL (ref 150–440)
RBC: 4.48 MIL/uL (ref 4.40–5.90)
RDW: 14.5 % (ref 11.5–14.5)
WBC: 9 10*3/uL (ref 3.8–10.6)

## 2016-05-11 MED ORDER — VANCOMYCIN HCL IN DEXTROSE 1-5 GM/200ML-% IV SOLN
INTRAVENOUS | Status: AC
  Filled 2016-05-11: qty 200

## 2016-05-11 MED ORDER — SODIUM CHLORIDE 0.9 % IV BOLUS (SEPSIS)
1000.0000 mL | Freq: Once | INTRAVENOUS | Status: AC
  Administered 2016-05-11: 1000 mL via INTRAVENOUS

## 2016-05-11 MED ORDER — PIPERACILLIN-TAZOBACTAM 3.375 G IVPB
INTRAVENOUS | Status: AC
  Filled 2016-05-11: qty 50

## 2016-05-11 NOTE — ED Triage Notes (Signed)
Pt is here with his daughter, states he is having increased confusion since being discharged last week for the same sx.Marland Kitchen

## 2016-05-11 NOTE — ED Provider Notes (Signed)
[facility] Emergency Department Provider Note ____________________________________________  Time seen:  I have reviewed the triage vital signs and the triage nursing note.  HISTORY  Chief Complaint Altered Mental Status   Historian Patient's daughter  HPI Jonathan Howell is a 75 y.o. male with recent hospital admission for altered mental status andit sounds like ultimately discharged without a certain diagnosis, but possibly dementia after consultation with neurologist, and psychiatrist as well as internal medicine, is brought back in by daughter after patient asked to be brought to the ER because he felt like he might be dying. It sounds like he has continued to have the same symptoms s he was recently discharged from the hospital, no worsening. No focal weakness. No fevers. No slurred speech or facial droop. No vomiting or diarrhea.  He has been living with the daughter for about 3 years now, and she doesn't report previous issues with confusion about who people were, and recently has been making dinner and putting toast in the ashtray, things like that.  He had a relatively normal workup with a negative MRI, other thanquestion raised about possible normal pressure hydrocephalus. He was found to have a renal stone that was essentially symptomatic, but he was set up for outpatient neurology follow-up for this.    Past Medical History:  Diagnosis Date  . COPD (chronic obstructive pulmonary disease) ([place])   . Hypertension   . Personal history of tobacco use, presenting hazards to health 07/14/2015    Patient Active Problem List   Diagnosis Date Noted  . hypokalemia 05/09/2016  . Hypokalemia 05/09/2016  . Hyponatremia 05/09/2016  . Essential hypertension 05/09/2016  . Tobacco abuse counseling 05/09/2016  . 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 Acute delirium 05/08/2016  . Acute  renal insufficiency 05/07/2016  . Personal history of tobacco use, presenting hazards to health 07/14/2015    Past Surgical History:  Procedure Laterality Date  . HEMORRHOID SURGERY      Prior to Admission medications   Medication Sig Start Date End Date Taking? Authorizing Provider  FLUoxetine (PROZAC) 20 MG tablet Take 40 mg by mouth daily.    Historical Provider, MD  lisinopril (PRINIVIL,ZESTRIL) 2.5 MG tablet Take 1 tablet (2.5 mg total) by mouth daily. 05/09/16   Theodoro Grist, MD  nicotine (NICODERM CQ - DOSED IN MG/24 HR) 7 mg/24hr patch Place 1 patch (7 mg total) onto the skin daily. 05/09/16   Theodoro Grist, MD  QUEtiapine (SEROQUEL) 400 MG tablet Take 1 tablet (400 mg total) by mouth at bedtime. 05/09/16   Theodoro Grist, MD    No Known Allergies  No family history on file.  Social History Social History  Substance Use Topics  . Smoking status: Heavy Tobacco Smoker  . Smokeless tobacco: Never Used  . Alcohol use 1.2 - 1.8 oz/week    2 - 3 Shots of liquor per week    Review of Systems  Constitutional: Negative for fever. Eyes: Negative for visual changes. ENT: Negative for sore throat. Cardiovascular: Negative for chest pain. Respiratory: Negative for shortness of breath. Gastrointestinal: Negative for abdominal pain, vomiting and diarrhea. Genitourinary: Negative for dysuria. Musculoskeletal: Negative for back pain. Skin: Negative for rash. Neurological: Negative for headache. 10 point Review of Systems otherwise negative ____________________________________________   PHYSICAL EXAM:  VITAL SIGNS: ED Triage Vitals  Enc Vitals Group     BP 05/11/16 1400 126/75     Pulse Rate 05/11/16  1400 93     Resp 05/11/16 1400 18     Temp 05/11/16 1400 98.3 F (36.8 C)     Temp Source 05/11/16 1400 Oral     SpO2 05/11/16 1400 95 %     Weight 05/11/16 1400 145 lb (65.8 kg)     Height 05/11/16 1400 '5\' 7"'$  (1.702 m)     Head Circumference --      Peak Flow --       Pain Score 05/11/16 1401 0     Pain Loc --      Pain Edu? --      Excl. in [place]? --      Constitutional: Alert and cooperative, but disoriented. Well appearing and in no distress. HEENT   Head: Normocephalic and atraumatic.      Eyes: Conjunctivae are normal. PERRL. Normal extraocular movements.      Ears:         Nose: No congestion/rhinnorhea.   Mouth/Throat: Mucous membranes are moist.   Neck: No stridor. Cardiovascular/Chest: Normal rate, regular rhythm.  No murmurs, rubs, or gallops. Respiratory: Normal respiratory effort without tachypnea nor retractions. Breath sounds are clear and equal bilaterally. No wheezes/rales/rhonchi. Gastrointestinal: Soft. No distention, no guarding, no rebound. Nontender.    Genitourinary/rectal:Deferred Musculoskeletal: Nontender with normal range of motion in all extremities. No joint effusions.  No lower extremity tenderness.  No edema. Neurologic: no slurred speech. Patient is disoriented. Normal speech and language. No gross or focal neurologic deficits are appreciated. Skin:  Skin is warm, dry and intact. No rash noted. Psychiatric: pleasant and cooperative. No apparent hallucinations  ____________________________________________   EKG I, Lisa Roca, MD, the attending physician have personally viewed and interpreted all ECGs.  none ____________________________________________  LABS (pertinent positives/negatives)  Labs Reviewed  COMPREHENSIVE METABOLIC PANEL - Abnormal; Notable for the following:       Result Value   Sodium 133 (*)    CO2 20 (*)    Glucose, Bld 101 (*)    BUN 36 (*)    Creatinine, Ser 1.46 (*)    Calcium 8.8 (*)    ALT 14 (*)    GFR calc non Af Amer 46 (*)    GFR calc Af Amer 53 (*)    All other components within normal limits  CBC - Abnormal; Notable for the following:    HCT 39.0 (*)    All other components within normal limits  URINALYSIS COMPLETEWITH MICROSCOPIC ([facility] ONLY) - Abnormal; Notable  for the following:    Color, Urine YELLOW (*)    APPearance CLEAR (*)    Squamous Epithelial / LPF 0-5 (*)    All other components within normal limits  CBG MONITORING, ED    ____________________________________________  RADIOLOGY All Xrays were viewed by me. Imaging interpreted by Radiologist.  none __________________________________________  PROCEDURES  Procedure(s) performed: None  Critical Care performed: None  ____________________________________________   ED COURSE / ASSESSMENT AND PLAN  Pertinent labs & imaging results that were available during my care of the patient were reviewed by me and considered in my medical decision making (see chart for details).   Patient states she is able to continue caring for patient at home.  No worsening since workup in the hospital this past week.  Although he made a statement about how he might be dying, there is no chest pain, vomiting, sweats, weakness or numbness associated with that complaint.  Labs show mild acute renal failure here and he was given iv fluids.  No new signs of stroke.  He is still disoriented but cooperative without hallucinations, agitation and no urinary symptoms or off balance in terms of the questionable diagnosis for normal pressure hydrocephalus -- will not re-scan today.  I reviewed the recent hospital admission and consults.  The patient has a primary care -up, as well as a psychiatric follow up info as well.  I do not see indication for hospitalization or emergency psychiatric evaluation.  Patient and family ok for discharge.   CONSULTATIONS:   None   Patient / Family / Caregiver informed of clinical course, medical decision-making process, and agree with plan.   I discussed return precautions, follow-up instructions, and discharged instructions with patient and/or family.   ___________________________________________   FINAL CLINICAL IMPRESSION(S) / ED DIAGNOSES   Final diagnoses:   Dementia, without behavioral disturbance  Altered mental status, unspecified altered mental status type              Note: This dictation was prepared with Dragon dictation. Any transcriptional errors that result from this process are unintentional    Lisa Roca, MD 05/11/16 2114

## 2016-05-11 NOTE — ED Notes (Signed)
Scanner is broken in this room. IT is aware.

## 2016-05-11 NOTE — Discharge Instructions (Signed)
You were evaluated for altered mental status ongoing since hospitalization, and your exam and evaluation are reassuring in the emergency department today. Return to the emergency department if there is any worsening condition including confusion altered mental status, violent behavior, thoughts of wanting to hurt herself or others, fever, peeing on yourself, off balance, weakness or numbness, or any other symptoms concerning to you.  You do need to keep your appointment with your primary care physician, and her psychiatrist. I will place the referral number for neurologist as well.

## 2016-05-13 ENCOUNTER — Ambulatory Visit (INDEPENDENT_AMBULATORY_CARE_PROVIDER_SITE_OTHER): Payer: Medicare Other | Admitting: Urology

## 2016-05-13 ENCOUNTER — Encounter: Payer: Self-pay | Admitting: Urology

## 2016-05-13 VITALS — BP 105/75 | HR 90 | Ht 67.0 in | Wt 143.9 lb

## 2016-05-13 DIAGNOSIS — N200 Calculus of kidney: Secondary | ICD-10-CM

## 2016-05-13 NOTE — Progress Notes (Signed)
H&P  Chief Complaint: left UPJ stone  History of Present Illness: Pt seen as recent hospital consult. He is a 75yo with a hx of dementia admitted with altered mental status, diarrhea, vomiting and abdominal pain. He underwent CT scan which showed a 1cm left UPJ calculus with no hydronephrosis. He had no localizing flank pain. He has never had a stone. A f/u KUB revealed diffuse gaseous bowel distention and a component of underlying ileus Suspected which made the stone harder to see but it was visible. The skin to stone distance was around 9.8 cm with Hounsfield units of 1338. His urine culture was negative  Today, he is well. He has no flank pain, dysuria or gross hematuria.   He has some baseline frequency, urgency, nocturia, hesitancy and straining. He was started on tamsulosin in the hospital.  Past Medical History:  Diagnosis Date  . COPD (chronic obstructive pulmonary disease) ([place])   . Hypertension   . Personal history of tobacco use, presenting hazards to health 07/14/2015   Past Surgical History:  Procedure Laterality Date  . HEMORRHOID SURGERY      Home Medications:   (Not in a hospital admission) Allergies: No Known Allergies  No family history on file. Social History:  reports that he has been smoking.  He has never used smokeless tobacco. He reports that he does not drink alcohol or use drugs.  ROS: A complete review of systems was performed.  All systems are negative except for pertinent findings as noted. Review of Systems  Constitutional: Positive for malaise/fatigue.  Genitourinary: Positive for frequency and urgency.  Neurological: Positive for dizziness.     Physical Exam:  Vital signs in last 24 hours: '@VSRANGES'$ @ General:  Alert and oriented, No acute distress HEENT: Normocephalic, atraumatic Cardiovascular: Regular rate and rhythm Lungs: Regular rate and effort Abdomen: Soft, nontender, nondistended, no abdominal masses Back: No CVA  tenderness Extremities: No edema Neurologic: Grossly intact  Laboratory Data:  No results found for this or any previous visit (from the past 24 hour(s)). Recent Results (from the past 240 hour(s))  Urine culture     Status: None   Collection Time: 05/06/16  8:41 PM  Result Value Ref Range Status   Specimen Description URINE, CLEAN CATCH  Final   Special Requests NONE  Final   Culture NO GROWTH Performed at [facility]   Final   Report Status 05/08/2016 FINAL  Final   Creatinine:  Recent Labs  05/06/16 1927 05/07/16 0608 05/08/16 0343 05/11/16 1404  CREATININE 1.39* 1.26* 1.16 1.46*    Impression/Assessment/plan: Left UPJ stone-I discussed again with the patient and his daughter the nature, risks and benefits of continued surveillance, ureteroscopy or shockwave lithotripsy. I went over the anatomy and CT findings on the poster board. All questions answered. They elected to proceed with shockwave lithotripsy. Discussed he may need a staged procedure.   Jovanna Hodges 05/13/2016, 11:49 AM

## 2016-05-16 ENCOUNTER — Ambulatory Visit
Admission: RE | Admit: 2016-05-16 | Discharge: 2016-05-16 | Disposition: A | Discharge status: Home / Self Care | Payer: Medicare Other | Source: Ambulatory Visit | Attending: Urology | Admitting: Urology

## 2016-05-16 ENCOUNTER — Encounter: Payer: Self-pay | Admitting: *Deleted

## 2016-05-16 ENCOUNTER — Encounter
Admission: RE | Disposition: A | Discharge status: Home / Self Care | Payer: Self-pay | Source: Ambulatory Visit | Attending: Urology

## 2016-05-16 DIAGNOSIS — J449 Chronic obstructive pulmonary disease, unspecified: Secondary | ICD-10-CM | POA: Diagnosis not present

## 2016-05-16 DIAGNOSIS — K449 Diaphragmatic hernia without obstruction or gangrene: Secondary | ICD-10-CM | POA: Diagnosis not present

## 2016-05-16 DIAGNOSIS — R351 Nocturia: Secondary | ICD-10-CM | POA: Diagnosis not present

## 2016-05-16 DIAGNOSIS — N200 Calculus of kidney: Secondary | ICD-10-CM | POA: Insufficient documentation

## 2016-05-16 DIAGNOSIS — R3916 Straining to void: Secondary | ICD-10-CM | POA: Insufficient documentation

## 2016-05-16 DIAGNOSIS — N201 Calculus of ureter: Secondary | ICD-10-CM | POA: Diagnosis present

## 2016-05-16 DIAGNOSIS — R3911 Hesitancy of micturition: Secondary | ICD-10-CM | POA: Diagnosis not present

## 2016-05-16 DIAGNOSIS — F17200 Nicotine dependence, unspecified, uncomplicated: Secondary | ICD-10-CM | POA: Diagnosis not present

## 2016-05-16 DIAGNOSIS — F0390 Unspecified dementia without behavioral disturbance: Secondary | ICD-10-CM | POA: Insufficient documentation

## 2016-05-16 DIAGNOSIS — Z9889 Other specified postprocedural states: Secondary | ICD-10-CM | POA: Diagnosis not present

## 2016-05-16 DIAGNOSIS — I10 Essential (primary) hypertension: Secondary | ICD-10-CM | POA: Diagnosis not present

## 2016-05-16 DIAGNOSIS — F319 Bipolar disorder, unspecified: Secondary | ICD-10-CM | POA: Diagnosis not present

## 2016-05-16 DIAGNOSIS — R350 Frequency of micturition: Secondary | ICD-10-CM | POA: Insufficient documentation

## 2016-05-16 DIAGNOSIS — R3915 Urgency of urination: Secondary | ICD-10-CM | POA: Insufficient documentation

## 2016-05-16 HISTORY — PX: EXTRACORPOREAL SHOCK WAVE LITHOTRIPSY: SHX1557

## 2016-05-16 SURGERY — LITHOTRIPSY, ESWL
Anesthesia: Moderate Sedation | Laterality: Left

## 2016-05-16 MED ORDER — HYDROCODONE-ACETAMINOPHEN 5-325 MG PO TABS: 1.0000 | ORAL_TABLET | Freq: Four times a day (QID) | ORAL | 0 refills | Status: DC | PRN

## 2016-05-16 MED ORDER — DIPHENHYDRAMINE HCL 25 MG PO CAPS
25.0000 mg | ORAL_CAPSULE | ORAL | Status: AC
  Administered 2016-05-16: 25 mg via ORAL

## 2016-05-16 MED ORDER — DOCUSATE SODIUM 100 MG PO CAPS: 100.0000 mg | ORAL_CAPSULE | Freq: Two times a day (BID) | ORAL | 0 refills | Status: AC

## 2016-05-16 MED ORDER — CIPROFLOXACIN HCL 500 MG PO TABS
500.0000 mg | ORAL_TABLET | ORAL | Status: AC
  Administered 2016-05-16: 500 mg via ORAL

## 2016-05-16 MED ORDER — DIPHENHYDRAMINE HCL 25 MG PO CAPS
ORAL_CAPSULE | ORAL | Status: AC
  Administered 2016-05-16: 25 mg via ORAL
  Filled 2016-05-16: qty 1

## 2016-05-16 MED ORDER — TAMSULOSIN HCL 0.4 MG PO CAPS: 0.4000 mg | ORAL_CAPSULE | Freq: Every day | ORAL | 0 refills | Status: DC

## 2016-05-16 MED ORDER — DEXTROSE-NACL 5-0.45 % IV SOLN
INTRAVENOUS | Status: DC
  Administered 2016-05-16: 11:00:00 via INTRAVENOUS

## 2016-05-16 MED ORDER — DIAZEPAM 5 MG PO TABS
10.0000 mg | ORAL_TABLET | Freq: Once | ORAL | Status: AC
  Administered 2016-05-16: 10 mg via ORAL

## 2016-05-16 MED ORDER — CIPROFLOXACIN HCL 500 MG PO TABS
ORAL_TABLET | ORAL | Status: AC
  Administered 2016-05-16: 500 mg via ORAL
  Filled 2016-05-16: qty 1

## 2016-05-16 MED ORDER — DIAZEPAM 5 MG PO TABS
ORAL_TABLET | ORAL | Status: AC
  Administered 2016-05-16: 10 mg via ORAL
  Filled 2016-05-16: qty 2

## 2016-05-16 NOTE — Discharge Instructions (Signed)
See [facility] discharge instructions in chart.  AMBULATORY SURGERY  DISCHARGE INSTRUCTIONS   1) The drugs that you were given will stay in your system until tomorrow so for the next 24 hours you should not:  A) Drive an automobile B) Make any legal decisions C) Drink any alcoholic beverage   2) You may resume regular meals tomorrow.  Today it is better to start with liquids and gradually work up to solid foods.  You may eat anything you prefer, but it is better to start with liquids, then soup and crackers, and gradually work up to solid foods.   3) Please notify your doctor immediately if you have any unusual bleeding, trouble breathing, redness and pain at the surgery site, drainage, fever, or pain not relieved by medication.    4) Additional Instructions:        Please contact your physician with any problems or Same Day Surgery at [phone], Monday through Friday 6 am to 4 pm, or  at [facility] number at [phone].   Lithotripsy, Care After Refer to this sheet in the next few weeks. These instructions provide you with information on caring for yourself after your procedure. Your health care provider may also give you more specific instructions. Your treatment has been planned according to current medical practices, but problems sometimes occur. Call your health care provider if you have any problems or questions after your procedure. WHAT TO EXPECT AFTER THE PROCEDURE   Your urine may have a red tinge for a few days after treatment. Blood loss is usually minimal.  You may have soreness in the back or flank area. This usually goes away after a few days. The procedure can cause blotches or bruises on the back where the pressure wave enters the skin. These marks usually cause only minimal discomfort and should disappear in a short time.  Stone fragments should begin to pass within 24 hours of treatment. However, a delayed passage is not  unusual.  You may have pain, discomfort, and feel sick to your stomach (nauseated) when the crushed fragments of stone are passed down the tube from the kidney to the bladder. Stone fragments can pass soon after the procedure and may last for up to 4-8 weeks.  A small number of patients may have severe pain when stone fragments are not able to pass, which leads to an obstruction.  If your stone is greater than 1 inch (2.5 cm) in diameter or if you have multiple stones that have a combined diameter greater than 1 inch (2.5 cm), you may require more than one treatment.  If you had a stent placed prior to your procedure, you may experience some discomfort, especially during urination. You may experience the pain or discomfort in your flank or back, or you may experience a sharp pain or discomfort at the base of your penis or in your lower abdomen. The discomfort usually lasts only a few minutes after urinating. HOME CARE INSTRUCTIONS   Rest at home until you feel your energy improving.  Only take over-the-counter or prescription medicines for pain, discomfort, or fever as directed by your health care provider. Depending on the type of lithotripsy, you may need to take antibiotics and anti-inflammatory medicines for a few days.  Drink enough water and fluids to keep your urine clear or pale yellow. This helps "flush" your kidneys. It helps pass any remaining pieces of stone and prevents stones from coming back.  Most people can resume daily  activities within 1-2 days after standard lithotripsy. It can take longer to recover from laser and percutaneous lithotripsy.  Strain all urine through the provided strainer. Keep all particulate matter and stones for your health care provider to see. The stone may be as small as a grain of salt. It is very important to use the strainer each and every time you pass your urine. Any stones that are found can be sent to a medical lab for examination.  Visit your  health care provider for a follow-up appointment in a few weeks. Your doctor may remove your stent if you have one. Your health care provider will also check to see whether stone particles still remain. SEEK MEDICAL CARE IF:   Your pain is not relieved by medicine.  You have a lasting nauseous feeling.  You feel there is too much blood in the urine.  You develop persistent problems with frequent or painful urination that does not at least partially improve after 2 days following the procedure.  You have a congested cough.  You feel lightheaded.  You develop a rash or any other signs that might suggest an allergic problem.  You develop any reaction or side effects to your medicine(s). SEEK IMMEDIATE MEDICAL CARE IF:   You experience severe back or flank pain or both.  You see nothing but blood when you urinate.  You cannot pass any urine at all.  You have a fever or shaking chills.  You develop shortness of breath, difficulty breathing, or chest pain.  You develop vomiting that will not stop after 6-8 hours.  You have a fainting episode.   This information is not intended to replace advice given to you by your health care provider. Make sure you discuss any questions you have with your health care provider.   Document Released: 10/20/2007 Document Revised: 06/21/2015 Document Reviewed: 04/15/2013 Elsevier Interactive Patient Education [employer].

## 2016-05-16 NOTE — Interval H&P Note (Signed)
History and Physical Interval Note:  05/16/2016 11:38 AM  Jonathan Howell  has presented today for surgery, with the diagnosis of KIDNEY STONES  The various methods of treatment have been discussed with the patient and family. After consideration of risks, benefits and other options for treatment, the patient has consented to  Procedure(s): EXTRACORPOREAL SHOCK WAVE LITHOTRIPSY (ESWL) (Left) as a surgical intervention .  The patient's history has been reviewed, patient examined, no change in status, stable for surgery.  I have reviewed the patient's chart and labs.  Questions were answered to the patient's satisfaction.     Hollice Espy

## 2016-05-16 NOTE — H&P (View-Only) (Signed)
H&P  Chief Complaint: left UPJ stone  History of Present Illness: Pt seen as recent hospital consult. He is a 75yo with a hx of dementia admitted with altered mental status, diarrhea, vomiting and abdominal pain. He underwent CT scan which showed a 1cm left UPJ calculus with no hydronephrosis. He had no localizing flank pain. He has never had a stone. A f/u KUB revealed diffuse gaseous bowel distention and a component of underlying ileus Suspected which made the stone harder to see but it was visible. The skin to stone distance was around 9.8 cm with Hounsfield units of 1338. His urine culture was negative  Today, he is well. He has no flank pain, dysuria or gross hematuria.   He has some baseline frequency, urgency, nocturia, hesitancy and straining. He was started on tamsulosin in the hospital.  Past Medical History:  Diagnosis Date  . COPD (chronic obstructive pulmonary disease) ([place])   . Hypertension   . Personal history of tobacco use, presenting hazards to health 07/14/2015   Past Surgical History:  Procedure Laterality Date  . HEMORRHOID SURGERY      Home Medications:   (Not in a hospital admission) Allergies: No Known Allergies  No family history on file. Social History:  reports that he has been smoking.  He has never used smokeless tobacco. He reports that he does not drink alcohol or use drugs.  ROS: A complete review of systems was performed.  All systems are negative except for pertinent findings as noted. Review of Systems  Constitutional: Positive for malaise/fatigue.  Genitourinary: Positive for frequency and urgency.  Neurological: Positive for dizziness.     Physical Exam:  Vital signs in last 24 hours: '@VSRANGES'$ @ General:  Alert and oriented, No acute distress HEENT: Normocephalic, atraumatic Cardiovascular: Regular rate and rhythm Lungs: Regular rate and effort Abdomen: Soft, nontender, nondistended, no abdominal masses Back: No CVA  tenderness Extremities: No edema Neurologic: Grossly intact  Laboratory Data:  No results found for this or any previous visit (from the past 24 hour(s)). Recent Results (from the past 240 hour(s))  Urine culture     Status: None   Collection Time: 05/06/16  8:41 PM  Result Value Ref Range Status   Specimen Description URINE, CLEAN CATCH  Final   Special Requests NONE  Final   Culture NO GROWTH Performed at [facility]   Final   Report Status 05/08/2016 FINAL  Final   Creatinine:  Recent Labs  05/06/16 1927 05/07/16 0608 05/08/16 0343 05/11/16 1404  CREATININE 1.39* 1.26* 1.16 1.46*    Impression/Assessment/plan: Left UPJ stone-I discussed again with the patient and his daughter the nature, risks and benefits of continued surveillance, ureteroscopy or shockwave lithotripsy. I went over the anatomy and CT findings on the poster board. All questions answered. They elected to proceed with shockwave lithotripsy. Discussed he may need a staged procedure.   Harvy Riera 05/13/2016, 11:49 AM

## 2016-05-21 ENCOUNTER — Other Ambulatory Visit: Payer: Self-pay | Admitting: Family Medicine

## 2016-05-21 DIAGNOSIS — R918 Other nonspecific abnormal finding of lung field: Secondary | ICD-10-CM

## 2016-06-04 ENCOUNTER — Ambulatory Visit (INDEPENDENT_AMBULATORY_CARE_PROVIDER_SITE_OTHER): Payer: Medicare Other | Admitting: Urology

## 2016-06-04 ENCOUNTER — Ambulatory Visit
Admission: RE | Admit: 2016-06-04 | Discharge: 2016-06-04 | Disposition: A | Discharge status: Home / Self Care | Payer: Medicare Other | Source: Ambulatory Visit | Attending: Urology | Admitting: Urology

## 2016-06-04 ENCOUNTER — Encounter: Payer: Self-pay | Admitting: Urology

## 2016-06-04 VITALS — BP 93/64 | HR 108 | Ht 62.0 in | Wt 141.0 lb

## 2016-06-04 DIAGNOSIS — N200 Calculus of kidney: Secondary | ICD-10-CM

## 2016-06-04 DIAGNOSIS — N201 Calculus of ureter: Secondary | ICD-10-CM

## 2016-06-04 NOTE — Progress Notes (Signed)
75 year old African-American male that is 3 weeks status post left-sided shockwave lithotripsy for a 10 mm left UPJ stone. Following the procedure, the patient has strained his urine but not collected any stone fragments. However, he has no ongoing pain, nausea, or voiding symptoms. He denies any fevers or chills.  Vitals:   06/04/16 1012  BP: 93/64  Pulse: (!) 108   NAD  KUB: KUB was performed prior to the patient's visit today that demonstrates resolution of the patient's left UPJ stone. There are no additional stones along the expected location of the left ureter.  Impression: I suspect the patient has passed the stones by not catching it while straining his urine. Plan: I went over general stone prevention strategies but namely recommended that the patient increase his fluid intake, minimize his dietary salt intake, and follow-up with [place] on an as-needed basis.

## 2016-11-19 ENCOUNTER — Inpatient Hospital Stay
Admission: EM | Admit: 2016-11-19 | Discharge: 2016-11-22 | DRG: 987 | Disposition: A | Discharge status: Home Health | Payer: Medicare Other | Attending: Specialist | Admitting: Specialist

## 2016-11-19 ENCOUNTER — Encounter: Payer: Self-pay | Admitting: Emergency Medicine

## 2016-11-19 ENCOUNTER — Emergency Department: Payer: Medicare Other

## 2016-11-19 DIAGNOSIS — J189 Pneumonia, unspecified organism: Principal | ICD-10-CM | POA: Diagnosis present

## 2016-11-19 DIAGNOSIS — E860 Dehydration: Secondary | ICD-10-CM | POA: Diagnosis present

## 2016-11-19 DIAGNOSIS — Z803 Family history of malignant neoplasm of breast: Secondary | ICD-10-CM

## 2016-11-19 DIAGNOSIS — R918 Other nonspecific abnormal finding of lung field: Secondary | ICD-10-CM | POA: Diagnosis present

## 2016-11-19 DIAGNOSIS — R590 Localized enlarged lymph nodes: Secondary | ICD-10-CM | POA: Diagnosis not present

## 2016-11-19 DIAGNOSIS — J441 Chronic obstructive pulmonary disease with (acute) exacerbation: Secondary | ICD-10-CM | POA: Diagnosis present

## 2016-11-19 DIAGNOSIS — C7951 Secondary malignant neoplasm of bone: Secondary | ICD-10-CM | POA: Diagnosis present

## 2016-11-19 DIAGNOSIS — R2681 Unsteadiness on feet: Secondary | ICD-10-CM

## 2016-11-19 DIAGNOSIS — N179 Acute kidney failure, unspecified: Secondary | ICD-10-CM | POA: Diagnosis present

## 2016-11-19 DIAGNOSIS — I129 Hypertensive chronic kidney disease with stage 1 through stage 4 chronic kidney disease, or unspecified chronic kidney disease: Secondary | ICD-10-CM | POA: Diagnosis present

## 2016-11-19 DIAGNOSIS — J440 Chronic obstructive pulmonary disease with acute lower respiratory infection: Secondary | ICD-10-CM | POA: Diagnosis present

## 2016-11-19 DIAGNOSIS — I10 Essential (primary) hypertension: Secondary | ICD-10-CM

## 2016-11-19 DIAGNOSIS — E876 Hypokalemia: Secondary | ICD-10-CM

## 2016-11-19 DIAGNOSIS — J449 Chronic obstructive pulmonary disease, unspecified: Secondary | ICD-10-CM

## 2016-11-19 DIAGNOSIS — N183 Chronic kidney disease, stage 3 (moderate): Secondary | ICD-10-CM | POA: Diagnosis present

## 2016-11-19 DIAGNOSIS — J9691 Respiratory failure, unspecified with hypoxia: Secondary | ICD-10-CM | POA: Diagnosis not present

## 2016-11-19 DIAGNOSIS — R000 Tachycardia, unspecified: Secondary | ICD-10-CM | POA: Diagnosis present

## 2016-11-19 DIAGNOSIS — E871 Hypo-osmolality and hyponatremia: Secondary | ICD-10-CM

## 2016-11-19 DIAGNOSIS — R911 Solitary pulmonary nodule: Secondary | ICD-10-CM

## 2016-11-19 DIAGNOSIS — R591 Generalized enlarged lymph nodes: Secondary | ICD-10-CM

## 2016-11-19 DIAGNOSIS — D649 Anemia, unspecified: Secondary | ICD-10-CM | POA: Diagnosis present

## 2016-11-19 DIAGNOSIS — J181 Lobar pneumonia, unspecified organism: Secondary | ICD-10-CM

## 2016-11-19 DIAGNOSIS — F17210 Nicotine dependence, cigarettes, uncomplicated: Secondary | ICD-10-CM | POA: Diagnosis present

## 2016-11-19 HISTORY — DX: Unspecified asthma, uncomplicated: J45.909

## 2016-11-19 LAB — BASIC METABOLIC PANEL
ANION GAP: 11 (ref 5–15)
BUN: 49 mg/dL — ABNORMAL HIGH (ref 6–20)
CALCIUM: 8.9 mg/dL (ref 8.9–10.3)
CO2: 20 mmol/L — ABNORMAL LOW (ref 22–32)
Chloride: 102 mmol/L (ref 101–111)
Creatinine, Ser: 1.8 mg/dL — ABNORMAL HIGH (ref 0.61–1.24)
GFR, EST AFRICAN AMERICAN: 41 mL/min — AB (ref >60)
GFR, EST NON AFRICAN AMERICAN: 35 mL/min — AB (ref >60)
Glucose, Bld: 109 mg/dL — ABNORMAL HIGH (ref 65–99)
Potassium: 4.1 mmol/L (ref 3.5–5.1)
SODIUM: 133 mmol/L — AB (ref 135–145)

## 2016-11-19 LAB — PROTIME-INR
INR: 1.27
Prothrombin Time: 16 seconds — ABNORMAL HIGH (ref 11.4–15.2)

## 2016-11-19 LAB — CBC
HCT: 29.9 % — ABNORMAL LOW (ref 40.0–52.0)
HEMOGLOBIN: 10.3 g/dL — AB (ref 13.0–18.0)
MCH: 29.8 pg (ref 26.0–34.0)
MCHC: 34.3 g/dL (ref 32.0–36.0)
MCV: 86.7 fL (ref 80.0–100.0)
PLATELETS: 239 10*3/uL (ref 150–440)
RBC: 3.45 MIL/uL — ABNORMAL LOW (ref 4.40–5.90)
RDW: 14.9 % — ABNORMAL HIGH (ref 11.5–14.5)
WBC: 10.2 10*3/uL (ref 3.8–10.6)

## 2016-11-19 LAB — LACTIC ACID, PLASMA: Lactic Acid, Venous: 1.9 mmol/L (ref 0.5–1.9)

## 2016-11-19 LAB — APTT: aPTT: 40 seconds — ABNORMAL HIGH (ref 24–36)

## 2016-11-19 LAB — BRAIN NATRIURETIC PEPTIDE: B NATRIURETIC PEPTIDE 5: 27 pg/mL (ref 0.0–100.0)

## 2016-11-19 LAB — MRSA PCR SCREENING: MRSA by PCR: NEGATIVE

## 2016-11-19 LAB — TROPONIN I

## 2016-11-19 MED ORDER — CEFTRIAXONE SODIUM-DEXTROSE 1-3.74 GM-% IV SOLR
1.0000 g | Freq: Once | INTRAVENOUS | Status: AC
  Administered 2016-11-19: 1 g via INTRAVENOUS
  Filled 2016-11-19: qty 50

## 2016-11-19 MED ORDER — ACETAMINOPHEN 650 MG RE SUPP: 650.0000 mg | Freq: Four times a day (QID) | RECTAL | Status: DC | PRN

## 2016-11-19 MED ORDER — SODIUM CHLORIDE 0.9 % IV SOLN
INTRAVENOUS | Status: AC
  Administered 2016-11-19: 14:00:00 via INTRAVENOUS

## 2016-11-19 MED ORDER — MOMETASONE FURO-FORMOTEROL FUM 200-5 MCG/ACT IN AERO
2.0000 | INHALATION_SPRAY | Freq: Two times a day (BID) | RESPIRATORY_TRACT | Status: DC
  Administered 2016-11-19 – 2016-11-22 (×6): 2 via RESPIRATORY_TRACT
  Filled 2016-11-19: qty 8.8

## 2016-11-19 MED ORDER — IPRATROPIUM-ALBUTEROL 0.5-2.5 (3) MG/3ML IN SOLN
3.0000 mL | Freq: Once | RESPIRATORY_TRACT | Status: AC
  Administered 2016-11-19: 3 mL via RESPIRATORY_TRACT
  Filled 2016-11-19: qty 3

## 2016-11-19 MED ORDER — TAMSULOSIN HCL 0.4 MG PO CAPS
0.4000 mg | ORAL_CAPSULE | Freq: Every day | ORAL | Status: DC
  Administered 2016-11-19 – 2016-11-21 (×3): 0.4 mg via ORAL
  Filled 2016-11-19 (×5): qty 1

## 2016-11-19 MED ORDER — ACETAMINOPHEN 325 MG PO TABS: 650.0000 mg | ORAL_TABLET | Freq: Four times a day (QID) | ORAL | Status: DC | PRN

## 2016-11-19 MED ORDER — CEFTRIAXONE SODIUM 1 G IJ SOLR
1.0000 g | Freq: Once | INTRAMUSCULAR | Status: DC
  Filled 2016-11-19: qty 10

## 2016-11-19 MED ORDER — SODIUM CHLORIDE 0.9 % IV BOLUS (SEPSIS)
1000.0000 mL | Freq: Once | INTRAVENOUS | Status: AC
  Administered 2016-11-19: 1000 mL via INTRAVENOUS

## 2016-11-19 MED ORDER — VANCOMYCIN HCL IN DEXTROSE 1-5 GM/200ML-% IV SOLN
1000.0000 mg | INTRAVENOUS | Status: DC
  Administered 2016-11-20: 1000 mg via INTRAVENOUS
  Filled 2016-11-19: qty 200

## 2016-11-19 MED ORDER — AZITHROMYCIN 500 MG IV SOLR
500.0000 mg | Freq: Once | INTRAVENOUS | Status: AC
  Administered 2016-11-19: 500 mg via INTRAVENOUS
  Filled 2016-11-19: qty 500

## 2016-11-19 MED ORDER — HYDROCODONE-ACETAMINOPHEN 5-325 MG PO TABS: 1.0000 | ORAL_TABLET | ORAL | Status: DC | PRN

## 2016-11-19 MED ORDER — DOCUSATE SODIUM 100 MG PO CAPS
100.0000 mg | ORAL_CAPSULE | Freq: Two times a day (BID) | ORAL | Status: DC
  Administered 2016-11-19 – 2016-11-22 (×4): 100 mg via ORAL
  Filled 2016-11-19 (×6): qty 1

## 2016-11-19 MED ORDER — NICOTINE 21 MG/24HR TD PT24
21.0000 mg | MEDICATED_PATCH | Freq: Every day | TRANSDERMAL | Status: DC
  Administered 2016-11-19 – 2016-11-22 (×4): 21 mg via TRANSDERMAL
  Filled 2016-11-19 (×4): qty 1

## 2016-11-19 MED ORDER — METHYLPREDNISOLONE SODIUM SUCC 125 MG IJ SOLR
125.0000 mg | Freq: Once | INTRAMUSCULAR | Status: AC
  Administered 2016-11-19: 125 mg via INTRAVENOUS
  Filled 2016-11-19: qty 2

## 2016-11-19 MED ORDER — VANCOMYCIN HCL IN DEXTROSE 1-5 GM/200ML-% IV SOLN
1000.0000 mg | Freq: Once | INTRAVENOUS | Status: AC
  Administered 2016-11-19: 1000 mg via INTRAVENOUS
  Filled 2016-11-19: qty 200

## 2016-11-19 MED ORDER — METHYLPREDNISOLONE SODIUM SUCC 125 MG IJ SOLR
60.0000 mg | INTRAMUSCULAR | Status: DC
  Administered 2016-11-19 – 2016-11-20 (×2): 60 mg via INTRAVENOUS
  Filled 2016-11-19 (×2): qty 2

## 2016-11-19 MED ORDER — PIPERACILLIN-TAZOBACTAM 3.375 G IVPB
3.3750 g | Freq: Three times a day (TID) | INTRAVENOUS | Status: DC
  Administered 2016-11-19 – 2016-11-21 (×5): 3.375 g via INTRAVENOUS
  Filled 2016-11-19 (×5): qty 50

## 2016-11-19 MED ORDER — IOPAMIDOL (ISOVUE-300) INJECTION 61%
60.0000 mL | Freq: Once | INTRAVENOUS | Status: AC | PRN
  Administered 2016-11-19: 60 mL via INTRAVENOUS

## 2016-11-19 MED ORDER — ONDANSETRON HCL 4 MG/2ML IJ SOLN: 4.0000 mg | Freq: Four times a day (QID) | INTRAMUSCULAR | Status: DC | PRN

## 2016-11-19 MED ORDER — ENOXAPARIN SODIUM 30 MG/0.3ML ~~LOC~~ SOLN: 30.0000 mg | SUBCUTANEOUS | Status: DC

## 2016-11-19 MED ORDER — POLYETHYLENE GLYCOL 3350 17 G PO PACK: 17.0000 g | PACK | Freq: Every day | ORAL | Status: DC | PRN

## 2016-11-19 MED ORDER — ONDANSETRON HCL 4 MG PO TABS: 4.0000 mg | ORAL_TABLET | Freq: Four times a day (QID) | ORAL | Status: DC | PRN

## 2016-11-19 MED ORDER — IPRATROPIUM-ALBUTEROL 0.5-2.5 (3) MG/3ML IN SOLN
RESPIRATORY_TRACT | Status: AC
  Administered 2016-11-19: 3 mL via RESPIRATORY_TRACT
  Filled 2016-11-19: qty 3

## 2016-11-19 MED ORDER — QUETIAPINE FUMARATE 100 MG PO TABS
400.0000 mg | ORAL_TABLET | Freq: Every day | ORAL | Status: DC
  Administered 2016-11-19 – 2016-11-21 (×3): 400 mg via ORAL
  Filled 2016-11-19 (×3): qty 4

## 2016-11-19 MED ORDER — LEVALBUTEROL HCL 1.25 MG/0.5ML IN NEBU
1.2500 mg | INHALATION_SOLUTION | Freq: Four times a day (QID) | RESPIRATORY_TRACT | Status: DC
  Administered 2016-11-19 – 2016-11-22 (×12): 1.25 mg via RESPIRATORY_TRACT
  Filled 2016-11-19 (×13): qty 0.5

## 2016-11-19 NOTE — ED Notes (Signed)
Pt on 2L via [place].

## 2016-11-19 NOTE — Progress Notes (Signed)
Discussed with on call oncologist US guided supraclavicular node biopsy requested for AM Lovenox discontinued Pt/INR and ptt ordered Staging work up including bone scan, CT abdomen and pelvis and also MRI brain with and without contrast ordered. NPO after midnight ordered

## 2016-11-19 NOTE — ED Triage Notes (Signed)
C/o cough and shortness of breath for 2-3 weeks. Denies chest pain. Not labored at this time. Skin warm and dry. Hx copd

## 2016-11-19 NOTE — Consult Note (Signed)
Hematology/Oncology Consult note [facility] Telephone:([phone] [phone]  Patient Care Team: Petra Kuba, MD as PCP - General (Family Medicine)   Name of the patient: Jonathan Howell  [id number]  1941/06/15    Reason for referral- new lung mass   Referring physician- Dr. Tressia Miners   Date of visit: 11/19/2016  History of presenting illness- Patient is a 75 yr old who has a h/o COPD not on home oxygen, HTN who presented to the hospital with worsening SOB that has been ongoing for 3 weeks ago. CXR showed possible pneumonia. CT thorax done today shows: 1. 5 cm right lower lobe lung mass with confluent right hilar and infrahilar tumor and ipsilateral and contralateral mediastinal lymphadenopathy. Obstructive pneumonia and probable interstitial spread of tumor. Pulmonary metastatic disease.Marland Kitchen No obvious upper abdominal metastatic disease or osseous metastatic disease. Severe underlying emphysema. Bilateral adrenal gland nodules, unchanged since 2016 and likely adenomas. We have been consulted for further management. Patient reports that his appetite has been fair. Denies any unintentional weight loss or pain at this time. He lives with his daughter and step son at baseline and is independent of his ADL's. He has been smoking 1/2 ppd for over 40 years   ECOG PS- 1  Pain scale- 0   Review of systems- Review of Systems  Constitutional: Positive for malaise/fatigue. Negative for chills, fever and weight loss.  HENT: Negative for congestion, ear discharge and nosebleeds.   Eyes: Negative for blurred vision.  Respiratory: Positive for cough and shortness of breath. Negative for hemoptysis, sputum production and wheezing.   Cardiovascular: Negative for chest pain, palpitations, orthopnea and claudication.  Gastrointestinal: Negative for abdominal pain, blood in stool, constipation, diarrhea, heartburn, melena, nausea and vomiting.  Genitourinary:  Negative for dysuria, flank pain, frequency, hematuria and urgency.  Musculoskeletal: Negative for back pain, joint pain and myalgias.  Skin: Negative for rash.  Neurological: Negative for dizziness, tingling, focal weakness, seizures, weakness and headaches.  Endo/Heme/Allergies: Does not bruise/bleed easily.  Psychiatric/Behavioral: Negative for depression and suicidal ideas. The patient does not have insomnia.     No Known Allergies  Patient Active Problem List   Diagnosis Date Noted  . Pneumonia 11/19/2016  . hypokalemia 05/09/2016  . Hypokalemia 05/09/2016  . Hyponatremia 05/09/2016  . Essential hypertension 05/09/2016  . Tobacco abuse counseling 05/09/2016  . 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 Acute delirium 05/08/2016  . Acute renal insufficiency 05/07/2016  . Personal history of tobacco use, presenting hazards to health 07/14/2015  . Atypical chest pain 08/07/2014     Past Medical History:  Diagnosis Date  . Asthma   . COPD (chronic obstructive pulmonary disease) ([place])   . Hypertension   . Personal history of tobacco use, presenting hazards to health 07/14/2015     Past Surgical History:  Procedure Laterality Date  . EXTRACORPOREAL SHOCK WAVE LITHOTRIPSY Left 05/16/2016   Procedure: EXTRACORPOREAL SHOCK WAVE LITHOTRIPSY (ESWL);  Surgeon: Hollice Espy, MD;  Location: [facility] ORS;  Service: Urology;  Laterality: Left;  . HEMORRHOID SURGERY      Social History   Social History  . Marital status: Widowed    Spouse name: N/A  . Number of children: N/A  . Years of education: N/A   Occupational History  . Not on file.   Social History Main Topics  . Smoking status: Heavy Tobacco Smoker  . Smokeless tobacco: Never Used  Comment: 1 pack per day  . Alcohol use No  . Drug use: No  . Sexual activity: Yes    Birth control/ protection: None   Other Topics Concern  . Not on  file   Social History Narrative   Lives with family. Independent at baseline.     Family History  Problem Relation Age of Onset  . Breast cancer Mother   . Suicidality Father      Current Facility-Administered Medications:  .  0.9 %  sodium chloride infusion, , Intravenous, Continuous, Gladstone Lighter, MD, Last Rate: 60 mL/hr at 11/19/16 1355 .  acetaminophen (TYLENOL) tablet 650 mg, 650 mg, Oral, Q6H PRN **OR** acetaminophen (TYLENOL) suppository 650 mg, 650 mg, Rectal, Q6H PRN, Gladstone Lighter, MD .  docusate sodium (COLACE) capsule 100 mg, 100 mg, Oral, BID, Gladstone Lighter, MD .  enoxaparin (LOVENOX) injection 30 mg, 30 mg, Subcutaneous, Q24H, Gladstone Lighter, MD .  HYDROcodone-acetaminophen (NORCO/VICODIN) 5-325 MG per tablet 1-2 tablet, 1-2 tablet, Oral, Q4H PRN, Gladstone Lighter, MD .  levalbuterol (XOPENEX) nebulizer solution 1.25 mg, 1.25 mg, Nebulization, Q6H, Gladstone Lighter, MD .  methylPREDNISolone sodium succinate (SOLU-MEDROL) 125 mg/2 mL injection 60 mg, 60 mg, Intravenous, Q24H, Gladstone Lighter, MD .  mometasone-formoterol (DULERA) 200-5 MCG/ACT inhaler 2 puff, 2 puff, Inhalation, BID, Gladstone Lighter, MD .  nicotine (NICODERM CQ - dosed in mg/24 hours) patch 21 mg, 21 mg, Transdermal, Daily, Gladstone Lighter, MD .  ondansetron (ZOFRAN) tablet 4 mg, 4 mg, Oral, Q6H PRN **OR** ondansetron (ZOFRAN) injection 4 mg, 4 mg, Intravenous, Q6H PRN, Gladstone Lighter, MD .  piperacillin-tazobactam (ZOSYN) IVPB 3.375 g, 3.375 g, Intravenous, Q8H, Sheema M Hallaji, RPH .  polyethylene glycol (MIRALAX / GLYCOLAX) packet 17 g, 17 g, Oral, Daily PRN, Gladstone Lighter, MD .  QUEtiapine (SEROQUEL) tablet 400 mg, 400 mg, Oral, QHS, Gladstone Lighter, MD .  tamsulosin (FLOMAX) capsule 0.4 mg, 0.4 mg, Oral, Daily, Gladstone Lighter, MD .  vancomycin (VANCOCIN) IVPB 1000 mg/200 mL premix, 1,000 mg, Intravenous, Once **FOLLOWED BY** [START ON 11/20/2016] vancomycin (VANCOCIN)  IVPB 1000 mg/200 mL premix, 1,000 mg, Intravenous, Q36H, Pernell Dupre, RPH   Physical exam:  Vitals:   11/19/16 1400 11/19/16 1415 11/19/16 1430 11/19/16 1500  BP: 120/80  129/89 127/84  Pulse: (!) 120 (!) 119 (!) 117 (!) 114  Resp: (!) 21 (!) 21 19 (!) 21  Temp:    98.4 F (36.9 C)  TempSrc:    Oral  SpO2: 96% 95% 93% 95%  Weight:    128 lb 11.2 oz (58.4 kg)  Height:    '5\' 2"'$  (1.575 m)   Physical Exam  Constitutional: He is oriented to person, place, and time and well-developed, well-nourished, and in no distress.  HENT:  Head: Normocephalic and atraumatic.  Eyes: EOM are normal. Pupils are equal, round, and reactive to light.  Neck: Normal range of motion.  Cardiovascular: Normal rate, regular rhythm and normal heart sounds.   Pulmonary/Chest: Effort normal.  Breath sounds decreased at bases  Abdominal: Soft. Bowel sounds are normal.  Lymphadenopathy:  No palpable adenopathy  Neurological: He is alert and oriented to person, place, and time.  Skin: Skin is warm and dry.       CMP Latest Ref Rng & Units 11/19/2016  Glucose 65 - 99 mg/dL 109(H)  BUN 6 - 20 mg/dL 49(H)  Creatinine 0.61 - 1.24 mg/dL 1.80(H)  Sodium 135 - 145 mmol/L 133(L)  Potassium 3.5 - 5.1 mmol/L 4.1  Chloride 101 -  111 mmol/L 102  CO2 22 - 32 mmol/L 20(L)  Calcium 8.9 - 10.3 mg/dL 8.9  Total Protein 6.5 - 8.1 g/dL -  Total Bilirubin 0.3 - 1.2 mg/dL -  Alkaline Phos 38 - 126 U/L -  AST 15 - 41 U/L -  ALT 17 - 63 U/L -   CBC Latest Ref Rng & Units 11/19/2016  WBC 3.8 - 10.6 K/uL 10.2  Hemoglobin 13.0 - 18.0 g/dL 10.3(L)  Hematocrit 40.0 - 52.0 % 29.9(L)  Platelets 150 - 440 K/uL 239    '@IMAGES'$ @  Ct Chest W Contrast  Result Date: 11/19/2016 CLINICAL DATA:  Cough and shortness of breath for 3 weeks. EXAM: CT CHEST WITH CONTRAST TECHNIQUE: Multidetector CT imaging of the chest was performed during intravenous contrast administration. CONTRAST:  22m ISOVUE-300 IOPAMIDOL (ISOVUE-300) INJECTION  61% COMPARISON:  Chest x-ray 11/19/2016 and chest CT 02/28/2015 FINDINGS: Chest wall: No chest wall mass or axillary adenopathy. 8.5 mm supraclavicular node noted on the right side on image 4. The thyroid gland appears normal. Cardiovascular: The heart is normal in size. No pericardial effusion. Scattered aortic calcifications but no aneurysm or dissection. Advanced three-vessel coronary artery calcifications. Mediastinum/Nodes: Extensive mediastinal and right hilar/ infrahilar lymphadenopathy. 14 mm pretracheal node on image number 53. 3.7 x 2.3 cm right hilar adenopathy. 17 mm subcarinal lymph node. 11 mm left infrahilar lymph node. Extensive tumor surrounding the right middle lobe pulmonary artery, main pulmonary artery and right lower lobe pulmonary artery and right lower lobe and middle lobe bronchi. The esophagus is grossly normal. Lungs/Pleura: 5 x 3.5 cm right lower lobe lung mass with adjacent confirm 1 infrahilar and hilar tumor. There is also obstructive pneumonitis and probable interstitial spread of tumor. Multiple bilateral pulmonary nodules, likely metastatic disease. 8 mm left lower lobe pulmonary nodule on image number 108 was present on the prior CT scan from 2016 but is slightly enlarged. This could be a synchronous primary. Upper Abdomen: Bilateral adrenal gland nodules appears stable since 2016. No obvious hepatic metastatic lesions Musculoskeletal: No significant bony findings. No obvious bone lesions to suggest metastatic disease. IMPRESSION: 1. 5 cm right lower lobe lung mass with confluent right hilar and infrahilar tumor and ipsilateral and contralateral mediastinal lymphadenopathy. 2. Obstructive pneumonia and probable interstitial spread of tumor. 3. Pulmonary metastatic disease. 4. No obvious upper abdominal metastatic disease or osseous metastatic disease. 5. Severe underlying emphysema. 6. Bilateral adrenal gland nodules, unchanged since 2016 and likely adenomas. Electronically Signed    By: PMarijo SanesM.D.   On: 11/19/2016 12:16   Dg Chest Portable 1 View  Result Date: 11/19/2016 CLINICAL DATA:  Cough and shortness of breath for 2 weeks.  COPD. EXAM: PORTABLE CHEST 1 VIEW COMPARISON:  05/06/2016 FINDINGS: Right infrahilar mass like appearance with a density in this region measuring about 6.0 by 4.8 cm. Surrounding airspace opacities and coarse interstitial opacities primarily in the right lower lobe and right middle lobe. Faint interstitial accentuation at the left lung base. Severe emphysema. Tortuous thoracic aorta. Severe degenerative glenohumeral arthropathy bilaterally. IMPRESSION: 1. Right infrahilar masslike appearance suspicious for malignancy. Conceivably this could be due to more focal consolidation in the setting of background coarse interstitial accentuation and airspace opacities in the right lower lobe and right middle lobe, rather than necessarily being from malignancy. Chest CT (with contrast if feasible) is recommended for further characterization. 2. Severe emphysema. 3. Tortuous thoracic aorta. 4. Severe degenerative glenohumeral arthropathy bilaterally. Electronically Signed   By: WCindra EvesD.  On: 11/19/2016 10:45    Assessment and plan- Patient is a 76 y.o. male with h/o COPD now has new lung mass and bilateral mediastinal and hilar adenopathy and multiple pulmonary nodules concerning for metastatic lung cancer  1. Please obtain complete staging work up including MRI brain with and without contrast, bone scan and CT abdomen for complete staging. I have spoken to IR and they could possibly attempt IR guided biopsy of supraclavicular LN tomorrow so please schedule that. I will see the patient after biopsy results are back to discuss further management  2. Normocytic anemia- could be from underlying malignancy- will do outpatient work up  3. AKI on CKD- probably from dehydration. Continue IVF   Thank you for this kind referral and the opportunity to  participate in the care of this patient. We will follow along   Visit Diagnosis 1. COPD with acute exacerbation ([place])   2. Right lower lobe lung mass   3. Community acquired pneumonia of right lower lobe of lung ([place])     Dr. Randa Evens, MD, MPH [place] at [facility] Pager- [phone] 11/19/2016

## 2016-11-19 NOTE — Progress Notes (Signed)
On-call Hospitalist paged for hypertension: 152/98 manually on repeat; denies pain or SOB, lying in bed, calm.  Waiting for callback. Barbaraann Faster, RN 8:43 PM 11/19/2016

## 2016-11-19 NOTE — ED Notes (Signed)
Pt to CT

## 2016-11-19 NOTE — ED Provider Notes (Signed)
[facility] Emergency Department Provider Note   ____________________________________________    I have reviewed the triage vital signs and the nursing notes.   HISTORY  Chief Complaint Cough     HPI Jonathan Howell is a 76 y.o. male who presents with complaints of shortness of breath. Patient reportsworsening shortness of breath over the last several days. He reports mild cough. He denies fevers. No nausea or vomiting. No chest pain. No recent travel. No calf pain or swelling. He does report a history of COPD. He continues to smoke.   Past Medical History:  Diagnosis Date  . Asthma   . COPD (chronic obstructive pulmonary disease) ([place])   . Hypertension   . Personal history of tobacco use, presenting hazards to health 07/14/2015    Patient Active Problem List   Diagnosis Date Noted  . hypokalemia 05/09/2016  . Hypokalemia 05/09/2016  . Hyponatremia 05/09/2016  . Essential hypertension 05/09/2016  . Tobacco abuse counseling 05/09/2016  . 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 Acute delirium 05/08/2016  . Acute renal insufficiency 05/07/2016  . Personal history of tobacco use, presenting hazards to health 07/14/2015  . Atypical chest pain 08/07/2014    Past Surgical History:  Procedure Laterality Date  . EXTRACORPOREAL SHOCK WAVE LITHOTRIPSY Left 05/16/2016   Procedure: EXTRACORPOREAL SHOCK WAVE LITHOTRIPSY (ESWL);  Surgeon: Hollice Espy, MD;  Location: [facility] ORS;  Service: Urology;  Laterality: Left;  . HEMORRHOID SURGERY      Prior to Admission medications   Medication Sig Start Date End Date Taking? Authorizing Provider  docusate sodium (COLACE) 100 MG capsule Take 1 capsule (100 mg total) by mouth 2 (two) times daily. 05/16/16  Yes Hollice Espy, MD  QUEtiapine (SEROQUEL) 400 MG tablet Take 1 tablet (400 mg total) by mouth at bedtime. 05/09/16  Yes Theodoro Grist, MD  tamsulosin (FLOMAX) 0.4 MG CAPS capsule Take 1 capsule (0.4 mg total) by mouth daily. 05/16/16  Yes Hollice Espy, MD     Allergies Patient has no known allergies.  History reviewed. No pertinent family history.  Social History Social History  Substance Use Topics  . Smoking status: Heavy Tobacco Smoker  . Smokeless tobacco: Never Used  . Alcohol use No    Review of Systems  Constitutional: No fever/chills  Cardiovascular: Denies chest pain. Respiratory: As above, no pleurisy Gastrointestinal: No abdominal pain.  No nausea, no vomiting.     Skin: Negative for rash. Neurological: Negative for headaches  10-point ROS otherwise negative.  ____________________________________________   PHYSICAL EXAM:  VITAL SIGNS: ED Triage Vitals  Enc Vitals Group     BP 11/19/16 1001 (!) 76/58     Pulse Rate 11/19/16 1001 (!) 113     Resp 11/19/16 1001 20     Temp 11/19/16 1001 98.3 F (36.8 C)     Temp src --      SpO2 11/19/16 1001 (!) 88 %     Weight 11/19/16 1004 143 lb (64.9 kg)     Height 11/19/16 1004 '5\' 2"'$  (1.575 m)     Head Circumference --      Peak Flow --      Pain Score --      Pain Loc --      Pain Edu? --      Excl. in [place]? --     Constitutional: Alert and oriented. No acute distress. Pleasant and interactive Eyes: Conjunctivae are  normal.   Nose: No congestion/rhinnorhea. Mouth/Throat: Mucous membranes are moist.    Cardiovascular:Tachycardia, regular rhythm. Grossly normal heart sounds.  Good peripheral circulation. Respiratory: Effort without tachypnea, wheezing bilaterally Gastrointestinal: Soft and nontender. No distention.  No CVA tenderness. Genitourinary: deferred Musculoskeletal: No lower extremity tenderness nor edema.  Warm and well perfused Neurologic:  Normal speech and language. No gross focal neurologic deficits are appreciated.  Skin:  Skin is warm, dry and intact. No rash noted. Psychiatric: Mood and affect are normal.  Speech and behavior are normal.  ____________________________________________   LABS (all labs ordered are listed, but only abnormal results are displayed)  Labs Reviewed  BASIC METABOLIC PANEL - Abnormal; Notable for the following:       Result Value   Sodium 133 (*)    CO2 20 (*)    Glucose, Bld 109 (*)    BUN 49 (*)    Creatinine, Ser 1.80 (*)    GFR calc non Af Amer 35 (*)    GFR calc Af Amer 41 (*)    All other components within normal limits  CBC - Abnormal; Notable for the following:    RBC 3.45 (*)    Hemoglobin 10.3 (*)    HCT 29.9 (*)    RDW 14.9 (*)    All other components within normal limits  CULTURE, BLOOD (ROUTINE X 2)  CULTURE, BLOOD (ROUTINE X 2)  TROPONIN I  BRAIN NATRIURETIC PEPTIDE  LACTIC ACID, PLASMA   ____________________________________________  EKG  ED ECG REPORT I, Lavonia Drafts, the attending physician, personally viewed and interpreted this ECG.  Date: 11/19/2016  Rate: 111 Rhythm: Sinus tachycardia QRS Axis: normal Intervals: normal ST/T Wave abnormalities: normal Conduction Disturbances: none   ____________________________________________  RADIOLOGY  Chest x-ray concerning for mass CT scan confirms a 5 cm mass with obstructive pneumonia ____________________________________________   PROCEDURES  Procedure(s) performed: No    Critical Care performed: yes  CRITICAL CARE Performed by: Lavonia Drafts   Total critical care time:30 minutes  Critical care time was exclusive of separately billable procedures and treating other patients.  Critical care was necessary to treat or prevent imminent or life-threatening deterioration.  Critical care was time spent personally by me on the following activities: development of treatment plan with patient and/or surrogate as well as nursing, discussions with consultants, evaluation of patient's response to treatment, examination of patient, obtaining history from patient or  surrogate, ordering and performing treatments and interventions, ordering and review of laboratory studies, ordering and review of radiographic studies, pulse oximetry and re-evaluation of patient's condition.  ____________________________________________   INITIAL IMPRESSION / ASSESSMENT AND PLAN / ED COURSE  Pertinent labs & imaging results that were available during my care of the patient were reviewed by me and considered in my medical decision making (see chart for details).  Patient presents with significant shortness of breath, and oxygen saturation saturations on presentation was 80%. Also significantly hypotensive however this resolved quickly with fluids. Patient was treated with Solu-Medrol, DuoNebs with improvement in his breathing. Chest x-ray concerning for mass, CT confirms mass with obstructive pneumonia, IV antibiotics started    ____________________________________________   FINAL CLINICAL IMPRESSION(S) / ED DIAGNOSES  Final diagnoses:  COPD with acute exacerbation ([place])  Right lower lobe lung mass  Community acquired pneumonia of right lower lobe of lung ([place])  Respiratory failure    NEW MEDICATIONS STARTED DURING THIS VISIT:  New Prescriptions   No medications on file     Note:  This document  was prepared using Systems analyst and may include unintentional dictation errors.    Lavonia Drafts, MD 11/19/16 [phone]

## 2016-11-19 NOTE — H&P (Addendum)
Jonathan Howell NAME: Jonathan Howell    MR#:  [id number]  DATE OF BIRTH:  06/18/1941  DATE OF ADMISSION:  11/19/2016  PRIMARY CARE PHYSICIAN: Petra Kuba, MD   REQUESTING/REFERRING PHYSICIAN: Dr. Lavonia Drafts  CHIEF COMPLAINT:   Chief Complaint  Patient presents with  . Cough    HISTORY OF PRESENT ILLNESS:  Jonathan Howell  is a 76 y.o. male with a known history of COPD not on home oxygen, hypertension, ongoing smoking presents to hospital secondary to worsening shortness of breath and cough for almost 3 weeks now. Patient states his symptoms started about 2-3 weeks ago, he has occasional episodes of nausea and coughing spells. Denies any fevers but has had occasional chills and has been losing weight in the last few weeks. His symptoms haven't improved in the last couple of weeks and today his breathing got much worse and so presented to the emergency room. Chest x-ray showed possible pneumonia and CT showing possible right lower lobe mass and postobstructive pneumonia. Patient has remained tachycardic, scant breath sounds on exam. Requiring oxygen here. He is being admitted for further pneumonia management and right lung mass workup.  PAST MEDICAL HISTORY:   Past Medical History:  Diagnosis Date  . Asthma   . COPD (chronic obstructive pulmonary disease) ([place])   . Hypertension   . Personal history of tobacco use, presenting hazards to health 07/14/2015    PAST SURGICAL HISTORY:   Past Surgical History:  Procedure Laterality Date  . EXTRACORPOREAL SHOCK WAVE LITHOTRIPSY Left 05/16/2016   Procedure: EXTRACORPOREAL SHOCK WAVE LITHOTRIPSY (ESWL);  Surgeon: Hollice Espy, MD;  Location: [facility] ORS;  Service: Urology;  Laterality: Left;  . HEMORRHOID SURGERY      SOCIAL HISTORY:   Social History  Substance Use Topics  . Smoking status: Heavy Tobacco Smoker  . Smokeless tobacco: Never Used  . Alcohol use No    FAMILY  HISTORY:   Family History  Problem Relation Age of Onset  . Breast cancer Mother   . Suicidality Father     DRUG ALLERGIES:  No Known Allergies  REVIEW OF SYSTEMS:   Review of Systems  Constitutional: Positive for malaise/fatigue and weight loss. Negative for chills and fever.  HENT: Negative for ear discharge, ear pain, hearing loss and nosebleeds.   Eyes: Negative for blurred vision and double vision.  Respiratory: Positive for cough and shortness of breath. Negative for hemoptysis and wheezing.   Cardiovascular: Positive for orthopnea. Negative for chest pain, palpitations and leg swelling.  Gastrointestinal: Negative for abdominal pain, constipation, diarrhea, melena, nausea and vomiting.  Genitourinary: Negative for dysuria and urgency.  Musculoskeletal: Negative for back pain, myalgias and neck pain.  Skin: Negative for rash.  Neurological: Negative for dizziness, sensory change, speech change, focal weakness and headaches.  Endo/Heme/Allergies: Does not bruise/bleed easily.  Psychiatric/Behavioral: Negative for depression.    MEDICATIONS AT HOME:   Prior to Admission medications   Medication Sig Start Date End Date Taking? Authorizing Provider  docusate sodium (COLACE) 100 MG capsule Take 1 capsule (100 mg total) by mouth 2 (two) times daily. 05/16/16  Yes Hollice Espy, MD  QUEtiapine (SEROQUEL) 400 MG tablet Take 1 tablet (400 mg total) by mouth at bedtime. 05/09/16  Yes Theodoro Grist, MD  tamsulosin (FLOMAX) 0.4 MG CAPS capsule Take 1 capsule (0.4 mg total) by mouth daily. 05/16/16  Yes Hollice Espy, MD      VITAL SIGNS:  Blood pressure 130/90,  pulse (!) 113, temperature 98.3 F (36.8 C), resp. rate 18, height '5\' 2"'$  (1.575 m), weight 64.9 kg (143 lb), SpO2 96 %.  PHYSICAL EXAMINATION:   Physical Exam  GENERAL:  76 y.o.-year-old thin built patient lying in the bed with no acute distress.  EYES: Pupils equal, round, reactive to light and accommodation. No scleral  icterus. Extraocular muscles intact.  HEENT: Head atraumatic, normocephalic. Oropharynx and nasopharynx clear.  NECK:  Supple, no jugular venous distention. No thyroid enlargement, no tenderness.  LUNGS: scant breath sounds bilaterally, no wheezing, rales  or crepitation. Scattered rhonchi at the bases. No use of accessory muscles of respiration.  CARDIOVASCULAR: S1, S2 normal. No murmurs, rubs, or gallops.  ABDOMEN: Soft, nontender, nondistended. Bowel sounds present. No organomegaly or mass.  EXTREMITIES: No pedal edema, cyanosis, or clubbing.  NEUROLOGIC: Cranial nerves II through XII are intact. Muscle strength 5/5 in all extremities. Sensation intact. Gait not checked.  PSYCHIATRIC: The patient is alert and oriented x 3.  SKIN: No obvious rash, lesion, or ulcer.   LABORATORY PANEL:   CBC  Recent Labs Lab 11/19/16 1018  WBC 10.2  HGB 10.3*  HCT 29.9*  PLT 239   ------------------------------------------------------------------------------------------------------------------  Chemistries   Recent Labs Lab 11/19/16 1018  NA 133*  K 4.1  CL 102  CO2 20*  GLUCOSE 109*  BUN 49*  CREATININE 1.80*  CALCIUM 8.9   ------------------------------------------------------------------------------------------------------------------  Cardiac Enzymes  Recent Labs Lab 11/19/16 1018  TROPONINI <0.03   ------------------------------------------------------------------------------------------------------------------  RADIOLOGY:  Ct Chest W Contrast  Result Date: 11/19/2016 CLINICAL DATA:  Cough and shortness of breath for 3 weeks. EXAM: CT CHEST WITH CONTRAST TECHNIQUE: Multidetector CT imaging of the chest was performed during intravenous contrast administration. CONTRAST:  19m ISOVUE-300 IOPAMIDOL (ISOVUE-300) INJECTION 61% COMPARISON:  Chest x-ray 11/19/2016 and chest CT 02/28/2015 FINDINGS: Chest wall: No chest wall mass or axillary adenopathy. 8.5 mm supraclavicular node  noted on the right side on image 4. The thyroid gland appears normal. Cardiovascular: The heart is normal in size. No pericardial effusion. Scattered aortic calcifications but no aneurysm or dissection. Advanced three-vessel coronary artery calcifications. Mediastinum/Nodes: Extensive mediastinal and right hilar/ infrahilar lymphadenopathy. 14 mm pretracheal node on image number 53. 3.7 x 2.3 cm right hilar adenopathy. 17 mm subcarinal lymph node. 11 mm left infrahilar lymph node. Extensive tumor surrounding the right middle lobe pulmonary artery, main pulmonary artery and right lower lobe pulmonary artery and right lower lobe and middle lobe bronchi. The esophagus is grossly normal. Lungs/Pleura: 5 x 3.5 cm right lower lobe lung mass with adjacent confirm 1 infrahilar and hilar tumor. There is also obstructive pneumonitis and probable interstitial spread of tumor. Multiple bilateral pulmonary nodules, likely metastatic disease. 8 mm left lower lobe pulmonary nodule on image number 108 was present on the prior CT scan from 2016 but is slightly enlarged. This could be a synchronous primary. Upper Abdomen: Bilateral adrenal gland nodules appears stable since 2016. No obvious hepatic metastatic lesions Musculoskeletal: No significant bony findings. No obvious bone lesions to suggest metastatic disease. IMPRESSION: 1. 5 cm right lower lobe lung mass with confluent right hilar and infrahilar tumor and ipsilateral and contralateral mediastinal lymphadenopathy. 2. Obstructive pneumonia and probable interstitial spread of tumor. 3. Pulmonary metastatic disease. 4. No obvious upper abdominal metastatic disease or osseous metastatic disease. 5. Severe underlying emphysema. 6. Bilateral adrenal gland nodules, unchanged since 2016 and likely adenomas. Electronically Signed   By: PMarijo SanesM.D.   On: 11/19/2016 12:16  Dg Chest Portable 1 View  Result Date: 11/19/2016 CLINICAL DATA:  Cough and shortness of breath for 2  weeks.  COPD. EXAM: PORTABLE CHEST 1 VIEW COMPARISON:  05/06/2016 FINDINGS: Right infrahilar mass like appearance with a density in this region measuring about 6.0 by 4.8 cm. Surrounding airspace opacities and coarse interstitial opacities primarily in the right lower lobe and right middle lobe. Faint interstitial accentuation at the left lung base. Severe emphysema. Tortuous thoracic aorta. Severe degenerative glenohumeral arthropathy bilaterally. IMPRESSION: 1. Right infrahilar masslike appearance suspicious for malignancy. Conceivably this could be due to more focal consolidation in the setting of background coarse interstitial accentuation and airspace opacities in the right lower lobe and right middle lobe, rather than necessarily being from malignancy. Chest CT (with contrast if feasible) is recommended for further characterization. 2. Severe emphysema. 3. Tortuous thoracic aorta. 4. Severe degenerative glenohumeral arthropathy bilaterally. Electronically Signed   By: Van Clines M.D.   On: 11/19/2016 10:45    EKG:   Orders placed or performed during the hospital encounter of 11/19/16  . ED EKG  . ED EKG  . EKG 12-Lead  . EKG 12-Lead    IMPRESSION AND PLAN:   Jonathan Howell  is a 76 y.o. male with a known history of COPD not on home oxygen, hypertension, ongoing smoking presents to hospital secondary to worsening shortness of breath and cough for almost 3 weeks now.  #1 Post obstructive pneumonia- likely from lung mass - admit, o2 support - vancomycin and zosyn - f/u blood cultures and MRSA pcr  #2 Right lung mass- current smoker, pulm consult and oncology consult - might need biopsy, already lung mets noted  #3 COPD- with exacerbation, IV solumedrol, xopenex nebs due to tachycardia o2 support, inhalers  #4 Sinus tachycardia- change nebs, monitor  #5 Tobacco use disorder- counseled, nicotine patch  #6 ARF- IV fluids, monitor, avoid nephrotoxins  #7 DVT prophylaxis-  started on lovenox- if plans for biopsy- please discontinue   All the records are reviewed and case discussed with ED provider. Management plans discussed with the patient, family and they are in agreement.  CODE STATUS: Full code  TOTAL TIME TAKING CARE OF THIS PATIENT: 50 minutes.    Jonathan Howell M.D on 11/19/2016 at 1:20 PM  Between 7am to 6pm - Pager - [phone]  After 6pm go to [email or web address] - password [facility]  Office  [phone]  CC: Primary care physician; Petra Kuba, MD

## 2016-11-19 NOTE — Progress Notes (Signed)
Dr. Ara Kussmaul notified of hypertension; Acknowledged, repeat BP in an hour to check for trend and go from there. Barbaraann Faster, RN 8:51 PM 11/19/2016

## 2016-11-19 NOTE — Progress Notes (Signed)
PHARMACIST - PHYSICIAN COMMUNICATION  CONCERNING:  Enoxaparin (Lovenox) for DVT Prophylaxis    RECOMMENDATION: Patient was prescribed enoxaprin '40mg'$  q24 hours for VTE prophylaxis.   Filed Weights   11/19/16 1004  Weight: 143 lb (64.9 kg)    Body mass index is 26.16 kg/m.  Estimated Creatinine Clearance: 27.8 mL/min (by C-G formula based on SCr of 1.8 mg/dL (H)).   Patient is candidate for enoxaparin '30mg'$  every 24 hours based on CrCl <46m/min   DESCRIPTION: Pharmacy has adjusted enoxaparin dose.  Patient is now receiving enoxaparin '30mg'$  every 24 hours.  SNancy Fetter PharmD Clinical Pharmacist  11/19/2016 3:36 PM

## 2016-11-19 NOTE — Progress Notes (Addendum)
Pharmacy Antibiotic Note  Jonathan Howell is a 76 y.o. male admitted on 11/19/2016 with  pneumonia/HCAP.  Pharmacy has been consulted for Vancomycin and Zosyn  Dosing. Patient received Ceftriaxone 1gm IV and Azithromycin '500mg'$  IV x 1 dose in ED.   Plan: Ke: 0.028   T1/2: 25hr   Vd: 46  Will start the patient on Vancomycin 1g IV every 36 hours with 10 hour stack dosing. Calculated trough at Css is  17.9. Monitor renal function daily and adjust dose as needed. Trough ordered prior to 4th dose. MRSA PCR ordered- if negative recommend discontinuing vancomycin.   Will start Zosyn 3.375 IV EI every 8 hours.   Height: '5\' 2"'$  (157.5 cm) Weight: 143 lb (64.9 kg) IBW/kg (Calculated) : 54.6  Temp (24hrs), Avg:98.3 F (36.8 C), Min:98.3 F (36.8 C), Max:98.3 F (36.8 C)   Recent Labs Lab 11/19/16 1018 11/19/16 1025  WBC 10.2  --   CREATININE 1.80*  --   LATICACIDVEN  --  1.9    Estimated Creatinine Clearance: 27.8 mL/min (by C-G formula based on SCr of 1.8 mg/dL (H)).    No Known Allergies  Antimicrobials this admission: 2/6 Azithromycin/ceftriaxone  X 1 dose 2/6 vancomycin >>  2/6 Zosyn >>  Dose adjustments this admission:  Microbiology results: 2/6  BCx: sent 2/6 MRSA PCR: pending   Thank you for allowing pharmacy to be a part of this patient's care.  Pernell Dupre, PharmD, BCPS Clinical Pharmacist 11/19/2016 2:19 PM

## 2016-11-20 ENCOUNTER — Inpatient Hospital Stay: Payer: Medicare Other

## 2016-11-20 LAB — CBC
HCT: 26.3 % — ABNORMAL LOW (ref 40.0–52.0)
HEMOGLOBIN: 9.1 g/dL — AB (ref 13.0–18.0)
MCH: 29.9 pg (ref 26.0–34.0)
MCHC: 34.8 g/dL (ref 32.0–36.0)
MCV: 86.1 fL (ref 80.0–100.0)
PLATELETS: 204 10*3/uL (ref 150–440)
RBC: 3.05 MIL/uL — AB (ref 4.40–5.90)
RDW: 15 % — ABNORMAL HIGH (ref 11.5–14.5)
WBC: 10.2 10*3/uL (ref 3.8–10.6)

## 2016-11-20 LAB — BASIC METABOLIC PANEL
ANION GAP: 8 (ref 5–15)
BUN: 35 mg/dL — ABNORMAL HIGH (ref 6–20)
CALCIUM: 8.2 mg/dL — AB (ref 8.9–10.3)
CO2: 22 mmol/L (ref 22–32)
Chloride: 107 mmol/L (ref 101–111)
Creatinine, Ser: 1.39 mg/dL — ABNORMAL HIGH (ref 0.61–1.24)
GFR, EST AFRICAN AMERICAN: 56 mL/min — AB (ref >60)
GFR, EST NON AFRICAN AMERICAN: 48 mL/min — AB (ref >60)
Glucose, Bld: 135 mg/dL — ABNORMAL HIGH (ref 65–99)
Potassium: 4.2 mmol/L (ref 3.5–5.1)
SODIUM: 137 mmol/L (ref 135–145)

## 2016-11-20 MED ORDER — GADOBENATE DIMEGLUMINE 529 MG/ML IV SOLN
11.0000 mL | Freq: Once | INTRAVENOUS | Status: AC | PRN
Start: 2016-11-20 — End: 2016-11-20
  Administered 2016-11-20: 11 mL via INTRAVENOUS

## 2016-11-20 MED ORDER — TECHNETIUM TC 99M MEDRONATE IV KIT
23.3800 | PACK | Freq: Once | INTRAVENOUS | Status: AC | PRN
  Administered 2016-11-20: 23.38 via INTRAVENOUS

## 2016-11-20 MED ORDER — IOPAMIDOL (ISOVUE-300) INJECTION 61%
75.0000 mL | Freq: Once | INTRAVENOUS | Status: AC | PRN
  Administered 2016-11-20: 75 mL via INTRAVENOUS

## 2016-11-20 MED ORDER — IOPAMIDOL (ISOVUE-300) INJECTION 61%
15.0000 mL | INTRAVENOUS | Status: AC
  Administered 2016-11-20 (×2): 15 mL via ORAL

## 2016-11-20 NOTE — Evaluation (Signed)
Physical Therapy Evaluation Patient Details Name: Jonathan Howell MRN: [id number] DOB: 02-05-1941 Today's Date: 11/20/2016   History of Present Illness  Jonathan Howell  is a 76 y.o. male with a known history of COPD on home oxygen (2L/min), hypertension, ongoing smoking presents to hospital secondary to worsening shortness of breath and cough for almost 3 weeks now. Patient states his symptoms started about 2-3 weeks ago, he has occasional episodes of nausea and coughing spells. Denies any fevers but has had occasional chills and has been losing weight in the last few weeks. His symptoms haven't improved in the last couple of weeks and today his breathing got much worse and so presented to the emergency room. Chest x-ray showed possible pneumonia and CT showing possible right lower lobe mass and postobstructive pneumonia. Patient has remained tachycardic, scant breath sounds on exam. He is being admitted for further pneumonia management and right lung mass workup.  Clinical Impression  Pt admitted with above diagnosis. Pt currently with functional limitations due to the deficits listed below (see PT Problem List).  Pt with deconditioning noted during functional mobility. He presents with some unsteadiness during transfers and ambulation. Pt ambulated on 4L/min O2 as he struggles to maintain SaO2 within acceptable range on 2 L/min with bed mobility. At doorway pt is at 91% but once he reaches RN station SaO2 drops to 78%. With standing rest break he is only able to recover to 81%. Pt returned to room and placed on 6 L/min. He still requires 2-3 minutes of pursed lip breathing for SaO2 to recover to 90%. Pt is visibly fatigued with accessory muscle use to assist with breathing. Pt with significant cardiopulmonary endurance. He does utilize 2 L/min O2 at home despite other notes indicating that he does not wear O2 at home. He needs to use rollator at discharge for added stability. He would benefit from [facility] PT for  strength/endurance training however currently refuses. Pt will benefit from skilled PT services to address deficits in strength, balance, and mobility in order to return to full function at home.      Follow Up Recommendations Home health PT;Other (comment) (Pt refuses currently)    Equipment Recommendations  None recommended by PT;Other (comment) (Should be using rollator at home)    Recommendations for Other Services       Precautions / Restrictions Precautions Precautions: Fall Restrictions Weight Bearing Restrictions: No      Mobility  Bed Mobility Overal bed mobility: Independent             General bed mobility comments: Good speed/sequencing. SaO2 drops to low 80's on 2L/min with bed mobility  Transfers Overall transfer level: Needs assistance Equipment used: Quad cane Transfers: Sit to/from Stand Sit to Stand: Supervision         General transfer comment: Pt demonstrates mild instabilty with transfers but is able to stabilize in standing without UE assist. Fair LE strength noted with good speed for transfers  Ambulation/Gait Ambulation/Gait assistance: Min assist Ambulation Distance (Feet): 80 Feet Assistive device: Quad cane Gait Pattern/deviations: Decreased step length - right;Decreased step length - left;Shuffle Gait velocity: Decreased Gait velocity interpretation: <1.8 ft/sec, indicative of risk for recurrent falls General Gait Details: Pt with decreased gait speed and poor cardiopulmonary endurance. Intermittently crosses midline with stepping especially as he becomes increasingly fatigued. Pt ambulated on 4L/min O2 as he struggles to maintain SaO2 with bed mobility on 2L/min. At doorway pt is at 91% but once reaches RN station SaO2 drops to 78%.  With standing rest break he is only able to recover to 81%. Pt returned to room and placed on 6 L/min. He still requires 2-3 minutes of pursed lip breathing for SaO2 to recover to 90%. Pt is visibly fatigued  with accessory muscle use to assist with breathing  Stairs            Wheelchair Mobility    Modified Rankin (Stroke Patients Only)       Balance Overall balance assessment: Needs assistance Sitting-balance support: No upper extremity supported Sitting balance-Leahy Scale: Good     Standing balance support: No upper extremity supported Standing balance-Leahy Scale: Fair Standing balance comment: Fair balance in wide stance. Pt struggles to maintain balance in narrow stance but is able to stabilize with quad cane                             Pertinent Vitals/Pain Pain Assessment: No/denies pain    Home Living Family/patient expects to be discharged to:: Private residence Living Arrangements: Children Available Help at Discharge: Family Type of Home: House Home Access: Stairs to enter Entrance Stairs-Rails: Right Entrance Stairs-Number of Steps: 4 Home Layout: One level Home Equipment: Dana - 4 wheels;Cane - quad;Shower seat;Bedside commode;Grab bars - toilet;Grab bars - tub/shower (Home O2, no hosptial bed)      Prior Function Level of Independence: Independent with assistive device(s)         Comments: Pt ambulates limited community distances with quad cane. Limited by breathing. 1 fall in the last 12 months. Independent with ADLs/IADLs     Hand Dominance   Dominant Hand: Right    Extremity/Trunk Assessment   Upper Extremity Assessment Upper Extremity Assessment: Overall WFL for tasks assessed    Lower Extremity Assessment Lower Extremity Assessment: Generalized weakness       Communication   Communication: No difficulties  Cognition Arousal/Alertness: Awake/alert Behavior During Therapy: WFL for tasks assessed/performed Overall Cognitive Status: Within Functional Limits for tasks assessed                      General Comments      Exercises     Assessment/Plan    PT Assessment Patient needs continued PT services   PT Problem List Decreased strength;Decreased activity tolerance;Decreased balance;Decreased mobility;Decreased safety awareness;Decreased knowledge of use of DME;Cardiopulmonary status limiting activity          PT Treatment Interventions DME instruction;Gait training;Stair training;Functional mobility training;Therapeutic activities;Therapeutic exercise;Balance training;Neuromuscular re-education;Patient/family education    PT Goals (Current goals can be found in the Care Plan section)  Acute Rehab PT Goals Patient Stated Goal: Return to prior function at home PT Goal Formulation: With patient Time For Goal Achievement: 12/04/16 Potential to Achieve Goals: Fair    Frequency Min 2X/week   Barriers to discharge Decreased caregiver support Pt reports he is home alone at times during work day    Co-evaluation               End of Session Equipment Utilized During Treatment: Gait belt;Oxygen Activity Tolerance: Treatment limited secondary to medical complications (Comment);Other (comment) (Limited by poor cardiopulmonary endurance, dropping SaO2) Patient left: in bed;with call bell/phone within reach;with bed alarm set Nurse Communication: Mobility status;Other (comment) (SaO2 readings, per RN increase to 3L/min at rest)         Time: 2440-1027 PT Time Calculation (min) (ACUTE ONLY): 31 min   Charges:   PT Evaluation $PT Eval Low  Complexity: 1 Procedure PT Treatments $Gait Training: 8-22 mins   PT G Codes:       Lyndel Safe Huprich PT, DPT   Huprich,Jason 11/20/2016, 3:42 PM

## 2016-11-20 NOTE — Progress Notes (Addendum)
[facility] [place] NAME: Jonathan Howell    MR#:  [id number]  DATE OF BIRTH:  Aug 04, 1941  SUBJECTIVE:   Came in with tness ofK came in with increasing shortness of breath and cough for 3 weeks REVIEW OF SYSTEMS:   Review of Systems  Constitutional: Negative for chills, fever and weight loss.  HENT: Negative for ear discharge, ear pain and nosebleeds.   Eyes: Negative for blurred vision, pain and discharge.  Respiratory: Positive for cough, sputum production and shortness of breath. Negative for wheezing and stridor.   Cardiovascular: Negative for chest pain, palpitations, orthopnea and PND.  Gastrointestinal: Negative for abdominal pain, diarrhea, nausea and vomiting.  Genitourinary: Negative for frequency and urgency.  Musculoskeletal: Negative for back pain and joint pain.  Neurological: Negative for sensory change, speech change, focal weakness and weakness.  Psychiatric/Behavioral: Negative for depression and hallucinations. The patient is not nervous/anxious.    Tolerating Diet: Yes Tolerating PT: Pending  DRUG ALLERGIES:  No Known Allergies  VITALS:  Blood pressure 124/70, pulse (!) 110, temperature 98.3 F (36.8 C), temperature source Oral, resp. rate 20, height '5\' 2"'$  (1.575 m), weight 58.4 kg (128 lb 11.2 oz), SpO2 95 %.  PHYSICAL EXAMINATION:   Physical Exam  GENERAL:  76 y.o.-year-old patient lying in the bed with no acute distress.  EYES: Pupils equal, round, reactive to light and accommodation. No scleral icterus. Extraocular muscles intact.  HEENT: Head atraumatic, normocephalic. Oropharynx and nasopharynx clear.  NECK:  Supple, no jugular venous distention. No thyroid enlargement, no tenderness.  LUNGS:Coarse breath sounds bilaterally, no wheezing, rales, rhonchi. No use of accessory muscles of respiration.  CARDIOVASCULAR: S1, S2 normal. No murmurs, rubs, or gallops.  ABDOMEN: Soft, nontender, nondistended.  Bowel sounds present. No organomegaly or mass.  EXTREMITIES: No cyanosis, clubbing or edema b/l.    NEUROLOGIC: Cranial nerves II through XII are intact. No focal Motor or sensory deficits b/l.   PSYCHIATRIC:  patient is alert and oriented x 3.  SKIN: No obvious rash, lesion, or ulcer.   LABORATORY PANEL:  CBC  Recent Labs Lab 11/20/16 0600  WBC 10.2  HGB 9.1*  HCT 26.3*  PLT 204    Chemistries   Recent Labs Lab 11/20/16 0600  NA 137  K 4.2  CL 107  CO2 22  GLUCOSE 135*  BUN 35*  CREATININE 1.39*  CALCIUM 8.2*   Cardiac Enzymes  Recent Labs Lab 11/19/16 1018  TROPONINI <0.03   RADIOLOGY:  Ct Chest W Contrast  Result Date: 11/19/2016 CLINICAL DATA:  Cough and shortness of breath for 3 weeks. EXAM: CT CHEST WITH CONTRAST TECHNIQUE: Multidetector CT imaging of the chest was performed during intravenous contrast administration. CONTRAST:  56m ISOVUE-300 IOPAMIDOL (ISOVUE-300) INJECTION 61% COMPARISON:  Chest x-ray 11/19/2016 and chest CT 02/28/2015 FINDINGS: Chest wall: No chest wall mass or axillary adenopathy. 8.5 mm supraclavicular node noted on the right side on image 4. The thyroid gland appears normal. Cardiovascular: The heart is normal in size. No pericardial effusion. Scattered aortic calcifications but no aneurysm or dissection. Advanced three-vessel coronary artery calcifications. Mediastinum/Nodes: Extensive mediastinal and right hilar/ infrahilar lymphadenopathy. 14 mm pretracheal node on image number 53. 3.7 x 2.3 cm right hilar adenopathy. 17 mm subcarinal lymph node. 11 mm left infrahilar lymph node. Extensive tumor surrounding the right middle lobe pulmonary artery, main pulmonary artery and right lower lobe pulmonary artery and right lower lobe and middle lobe bronchi. The esophagus is grossly  normal. Lungs/Pleura: 5 x 3.5 cm right lower lobe lung mass with adjacent confirm 1 infrahilar and hilar tumor. There is also obstructive pneumonitis and probable  interstitial spread of tumor. Multiple bilateral pulmonary nodules, likely metastatic disease. 8 mm left lower lobe pulmonary nodule on image number 108 was present on the prior CT scan from 2016 but is slightly enlarged. This could be a synchronous primary. Upper Abdomen: Bilateral adrenal gland nodules appears stable since 2016. No obvious hepatic metastatic lesions Musculoskeletal: No significant bony findings. No obvious bone lesions to suggest metastatic disease. IMPRESSION: 1. 5 cm right lower lobe lung mass with confluent right hilar and infrahilar tumor and ipsilateral and contralateral mediastinal lymphadenopathy. 2. Obstructive pneumonia and probable interstitial spread of tumor. 3. Pulmonary metastatic disease. 4. No obvious upper abdominal metastatic disease or osseous metastatic disease. 5. Severe underlying emphysema. 6. Bilateral adrenal gland nodules, unchanged since 2016 and likely adenomas. Electronically Signed   By: Marijo Sanes M.D.   On: 11/19/2016 12:16   Mr Jeri Cos HU Contrast  Result Date: 11/20/2016 CLINICAL DATA:  76 year old male with increasing cough and shortness of Breath. Chest imaging shows evidence of right lung mass, hilar tumor, and pulmonary metastatic disease. EXAM: MRI HEAD WITHOUT AND WITH CONTRAST TECHNIQUE: Multiplanar, multiecho pulse sequences of the brain and surrounding structures were obtained without and with intravenous contrast. CONTRAST:  77m MULTIHANCE GADOBENATE DIMEGLUMINE 529 MG/ML IV SOLN COMPARISON:  Brain MRI without contrast 05/08/2016 FINDINGS: Brain: No abnormal or suspicious enhancement of the brain on axial or coronal postcontrast images. However, on sagittal post-contrast images (series 13 images 11 and 17) there are 2 small 1-2 mm foci of nodular enhancement. I favor these are vascular related rather than very small brain metastases. There is pulsation artifact in the posterior fossa on sagittal and coronal post-contrast images. No dural  thickening. No restricted diffusion to suggest acute infarction. No midline shift, mass effect, evidence of mass lesion, ventriculomegaly, extra-axial collection or acute intracranial hemorrhage. Pituitary within normal limits. Nonspecific cerebral white matter T2 and FLAIR hyperintensity is mild to moderate for age. No cortical encephalomalacia or chronic cerebral blood products. There are several tiny chronic lacunar infarcts in the cerebellar hemispheres, more so the left. Vascular: Major intracranial vascular flow voids are stable and within normal limits. Skull and upper cervical spine: Advanced degenerative changes in the cervical spine including ligamentous hypertrophy about the odontoid and multilevel spondylolisthesis. There is some associated degenerative cervicomedullary junction and spinal stenosis but otherwise negative visualized cervical spinal cord. Visualized bone marrow signal is within normal limits. Sinuses/Orbits: Negative orbits soft tissues. Right frontal and ethmoid sinus mucosal thickening and bubbly opacity. Other Visualized paranasal sinuses and mastoids are well pneumatized. Other: Visible internal auditory structures appear normal. Negative scalp soft tissues. IMPRESSION: 1. Difficult to exclude two very early brain metastases in both superior frontal lobes, but favor instead benign vascular related enhancement. Repeat Brain MRI without and with contrast recommended in 6-8 weeks (ideally an SRS protocol study performed on a 3 Tesla MRI unit). 2. Otherwise no metastatic disease or acute intracranial abnormality. 3. Advanced cervical spine degeneration. Electronically Signed   By: HGenevie AnnM.D.   On: 11/20/2016 10:47   Nm Bone Scan Whole Body  Result Date: 11/20/2016 CLINICAL DATA:  History of lung mass EXAM: NUCLEAR MEDICINE WHOLE BODY BONE SCAN TECHNIQUE: Whole body anterior and posterior images were obtained approximately 3 hours after intravenous injection of radiopharmaceutical.  RADIOPHARMACEUTICALS:  23.38 mCi Technetium-9109mDP IV COMPARISON:  None.  FINDINGS: There is adequate uptake of radioactive tracer throughout the bony skeleton. Bilateral renal activity is noted. Scattered areas of increased activity are identified in the rib cage bilaterally both anteriorly and posteriorly as well as within the iliac bones adjacent to the sacroiliac joint as well as the left ischial tuberosity. Changes in the sternum and to a lesser degree in the thoracic and lumbar spine are noted. These areas are consistent with metastatic disease. Degenerative changes are noted in the knee joints and tarsal bones as well as the shoulder joints bilaterally. Changes of prior hip replacement are noted. IMPRESSION: Diffuse osseous metastatic disease as described. Electronically Signed   By: Inez Catalina M.D.   On: 11/20/2016 11:50   Ct Abdomen Pelvis W Contrast  Result Date: 11/20/2016 CLINICAL DATA:  Abnormal CT chest demonstrating 5 cm RIGHT lower lobe mass with confluent hilar/mediastinal adenopathy, pulmonary nodules, and interstitial infiltrates question lymphangitic spread of tumor EXAM: CT ABDOMEN AND PELVIS WITH CONTRAST TECHNIQUE: Multidetector CT imaging of the abdomen and pelvis was performed using the standard protocol following bolus administration of intravenous contrast. Sagittal and coronal MPR images reconstructed from axial data set. CONTRAST:  28m ISOVUE-300 IOPAMIDOL (ISOVUE-300) INJECTION 61% IV. Dilute oral contrast. COMPARISON:  CT abdomen pelvis 05/06/2016 FINDINGS: Lower chest: 5.3 x 3.9 cm RIGHT lower lobe mass with confluent hilar and infrahilar adenopathy. Diffuse interstitial infiltrates throughout the visualized RIGHT middle and RIGHT lower lobes question lymphangitic tumor spread. LEFT basilar pulmonary nodule 9 mm diameter. Central peribronchial thickening. Hepatobiliary: Depending calculi within gallbladder. Liver and gallbladder otherwise normal appearance. Pancreas: Normal  appearance Spleen: Normal appearance Adrenals/Urinary Tract: Mild diffuse low attenuation nodular thickening of both adrenal glands unchanged. Tiny nonobstructing LEFT renal calculi. BILATERAL renal cortical thinning. Deformity of the lateral margin of the LEFT kidney, new since previous exam likely representing a small subcapsular fluid collection 2.0 x 1.1 cm image 33. No hydronephrosis or hydroureter. Bladder poorly visualized due to beam hardening artifacts in pelvis, though bladder contains excreted contrast material. Prostate gland obscured. Stomach/Bowel: Normal appendix. Descending and sigmoid diverticulosis. Portions of pelvic bowel loops obscured by beam hardening artifacts. Stomach and bowel loops otherwise unremarkable. Vascular/Lymphatic: Atherosclerotic calcifications aorta and iliac arteries. Significant calcified plaque at the origins of the renal arteries bilaterally question renal artery stenosis. Additional significant plaque at the origins of the SMA and IMA. Coronary toe calcifications. Mitral annular calcifications. No definite adenopathy. Reproductive: N/A Other: No free air free fluid. Musculoskeletal: Significant beam hardening artifacts in pelvis from BILATERAL hip prostheses. Osseous demineralization. Degenerative disc disease changes lumbar spine. No acute bony findings. IMPRESSION: RIGHT lower lobe neoplasm with extensive RIGHT hilar and infrahilar adenopathy as well as probable lymphangitic tumor spread throughout RIGHT lung. LEFT lower lobe pulmonary nodule. No definite intra-abdominal or intrapelvic metastases. Cholelithiasis. Aortic atherosclerosis and coronary arterial calcifications with significant stenoses at the origins of the renal arteries and SMA. Limitations of pelvic imaging secondary to beam hardening artifacts from BILATERAL hip prostheses. Electronically Signed   By: MLavonia DanaM.D.   On: 11/20/2016 11:58   Dg Chest Portable 1 View  Result Date: 11/19/2016 CLINICAL  DATA:  Cough and shortness of breath for 2 weeks.  COPD. EXAM: PORTABLE CHEST 1 VIEW COMPARISON:  05/06/2016 FINDINGS: Right infrahilar mass like appearance with a density in this region measuring about 6.0 by 4.8 cm. Surrounding airspace opacities and coarse interstitial opacities primarily in the right lower lobe and right middle lobe. Faint interstitial accentuation at the left lung base. Severe  emphysema. Tortuous thoracic aorta. Severe degenerative glenohumeral arthropathy bilaterally. IMPRESSION: 1. Right infrahilar masslike appearance suspicious for malignancy. Conceivably this could be due to more focal consolidation in the setting of background coarse interstitial accentuation and airspace opacities in the right lower lobe and right middle lobe, rather than necessarily being from malignancy. Chest CT (with contrast if feasible) is recommended for further characterization. 2. Severe emphysema. 3. Tortuous thoracic aorta. 4. Severe degenerative glenohumeral arthropathy bilaterally. Electronically Signed   By: Van Clines M.D.   On: 11/19/2016 10:45   ASSESSMENT AND PLAN:  Heith Haigler  is a 76 y.o. male with a known history of COPD not on home oxygen, hypertension, ongoing smoking presents to hospital secondary to worsening shortness of breath and cough for almost 3 weeks now.  #1 Post obstructive pneumonia- likely from lung mass - admit, o2 support - vancomycin and zosyn---d/c vanc since MRSA PCR neg -  blood cultures  negative  #2 Right lung mass- current smoker, -Oncology consult appreciated.  -NM body scan, CT abdomen shows mets to the bones and MRI brain ?two lesions suspicious for mets -Further cancer workup as out pt -Patient to get ultrasound-guided biopsy from the supraclavicular area tomorrow per interventional radiology  #3 COPD- with exacerbation, IV solumedrol, xopenex nebs due to tachycardia o2 support, inhalers  #4 Sinus tachycardia- change nebs, monitor  #5  Tobacco use disorder- counseled, nicotine patch  #6 ARF- -Received IV fluids -Avoid nephrotoxins  #7 DVT prophylaxis- started on lovenox- if plans for biopsy- please discontinue  Case discussed with Care Management/Social Worker. Management plans discussed with the patient, family and they are in agreement.  CODE STATUS: Full  DVT Prophylaxis: On hold due to planned biopsy tomorrow TOTAL TIME TAKING CARE OF THIS PATIENT: 40 minutes.  >50% time spent on counselling and coordination of care    Note: This dictation was prepared with Dragon dictation along with smaller phrase technology. Any transcriptional errors that result from this process are unintentional.  Estefany Goebel M.D on 11/20/2016 at 1:21 PM  Between 7am to 6pm - Pager - [phone]  After 6pm go to [email or web address] - password [email or web address]  Office  [phone]  CC: Primary care physician; Petra Kuba, MD

## 2016-11-21 ENCOUNTER — Inpatient Hospital Stay: Payer: Medicare Other

## 2016-11-21 MED ORDER — MIDAZOLAM HCL 2 MG/2ML IJ SOLN
INTRAMUSCULAR | Status: AC | PRN
  Administered 2016-11-21: 0.5 mg via INTRAVENOUS

## 2016-11-21 MED ORDER — FENTANYL CITRATE (PF) 100 MCG/2ML IJ SOLN
INTRAMUSCULAR | Status: AC | PRN
  Administered 2016-11-21: 25 ug via INTRAVENOUS

## 2016-11-21 MED ORDER — PREDNISONE 10 MG PO TABS: 50.0000 mg | ORAL_TABLET | Freq: Every day | ORAL | 0 refills | Status: AC

## 2016-11-21 MED ORDER — MIDAZOLAM HCL 2 MG/2ML IJ SOLN
INTRAMUSCULAR | Status: AC
  Filled 2016-11-21: qty 2

## 2016-11-21 MED ORDER — LEVALBUTEROL HCL 1.25 MG/0.5ML IN NEBU: 1.2500 mg | INHALATION_SOLUTION | Freq: Four times a day (QID) | RESPIRATORY_TRACT | 12 refills | Status: AC

## 2016-11-21 MED ORDER — FENTANYL CITRATE (PF) 100 MCG/2ML IJ SOLN
INTRAMUSCULAR | Status: AC
Start: 2016-11-21 — End: 2016-11-22
  Filled 2016-11-21: qty 2

## 2016-11-21 MED ORDER — MOMETASONE FURO-FORMOTEROL FUM 200-5 MCG/ACT IN AERO: 2.0000 | INHALATION_SPRAY | Freq: Two times a day (BID) | RESPIRATORY_TRACT | 0 refills | Status: AC

## 2016-11-21 MED ORDER — PREDNISONE 10 MG PO TABS
50.0000 mg | ORAL_TABLET | Freq: Every day | ORAL | Status: DC
  Administered 2016-11-21 – 2016-11-22 (×2): 50 mg via ORAL
  Filled 2016-11-21 (×2): qty 2

## 2016-11-21 MED ORDER — AMOXICILLIN-POT CLAVULANATE 875-125 MG PO TABS
1.0000 | ORAL_TABLET | Freq: Two times a day (BID) | ORAL | Status: DC
  Administered 2016-11-21 – 2016-11-22 (×3): 1 via ORAL
  Filled 2016-11-21 (×3): qty 1

## 2016-11-21 MED ORDER — AMOXICILLIN-POT CLAVULANATE 875-125 MG PO TABS: 1.0000 | ORAL_TABLET | Freq: Two times a day (BID) | ORAL | 0 refills | Status: AC

## 2016-11-21 NOTE — Care Management (Signed)
Patient admitted with Post obstructive pneumonia, likely from lung mass.  Patient lives at home with daughter and grandson.  Patient states that he has RW, WC in the home.  PT has assessed patient and recommended home health PT.  PCP [facility] at [facility].  Obtains medication at [facility].  Patient has qualifying sats for home O2.  Portable tank delivered to room by Corene Cornea from [facility].  Patient offered home health agency preference.  Patient states that he does not have a preference.  Case reviewed with [facility] liaison.  She states that patient would be appropriate for their services.  Case would not be able to be opened until Monday.  Plan for discharge 11/22/16.  Will follow up tomorrow to see if initiation of serivces on Monday is appropriate.

## 2016-11-21 NOTE — Progress Notes (Signed)
Physical Therapy Treatment Patient Details Name: Jonathan Howell MRN: [id number] DOB: November 29, 1940 Today's Date: 11/21/2016    History of Present Illness Jonathan Howell  is a 76 y.o. male with a known history of COPD on home oxygen (2L/min), hypertension, ongoing smoking presents to hospital secondary to worsening shortness of breath and cough for almost 3 weeks now. Patient states his symptoms started about 2-3 weeks ago, he has occasional episodes of nausea and coughing spells. Denies any fevers but has had occasional chills and has been losing weight in the last few weeks. His symptoms haven't improved in the last couple of weeks and today his breathing got much worse and so presented to the emergency room. Chest x-ray showed possible pneumonia and CT showing possible right lower lobe mass and postobstructive pneumonia. Patient has remained tachycardic, scant breath sounds on exam. He is being admitted for further pneumonia management and right lung mass workup.    PT Comments    Pt agreeable to session.  3lpm at rest, 89% P 124  Pt sat edge of bed without assist to obtain qualifying O2 sats.  O2 on rom air after 90 seconds while standing decreased to 82% P 127.  O2 was placed back on and increased to 91% P 127 with seated rest.  He stood at bedside with O2 at 3lpm for toe raised and marches x 10 where O2 then decreased to 83%.  It did not return with deep breathing and he was increased to 6 lpm where he recovered to 91%.  Discussed at length energy conservation techniques for discharge to home.  He stated he as rolling walker which may be beneficial to him for mobility to conserve energy along with a wheelchair for transport if needed.    He stated he is comfortable with discharge plan.   Follow Up Recommendations  Home health PT;Other (comment)     Equipment Recommendations  None recommended by PT;Other (comment)    Recommendations for Other Services       Precautions / Restrictions  Precautions Precautions: Fall Precaution Comments: O2 sats and HR monitoring Restrictions Weight Bearing Restrictions: No    Mobility  Bed Mobility Overal bed mobility: Independent                Transfers Overall transfer level: Independent Equipment used: Radio broadcast assistant: Sit to/from [employer] to Stand: Supervision            Ambulation/Gait             General Gait Details: deferred gait due to O2 sats   Stairs            Wheelchair Mobility    Modified Rankin (Stroke Patients Only)       Balance Overall balance assessment: Needs assistance Sitting-balance support: No upper extremity supported Sitting balance-Leahy Scale: Good     Standing balance support: No upper extremity supported;Single extremity supported Standing balance-Leahy Scale: Fair Standing balance comment: holds onto sidetable along with quad cane for support with dynamic activities.                    Cognition Arousal/Alertness: Awake/alert Behavior During Therapy: WFL for tasks assessed/performed Overall Cognitive Status: Within Functional Limits for tasks assessed                      Exercises Other Exercises Other Exercises: toe raises and marches x 10 bilaterally in standing.    General Comments  Pertinent Vitals/Pain Pain Assessment: No/denies pain    Home Living                      Prior Function            PT Goals (current goals can now be found in the care plan section) Progress towards PT goals: Progressing toward goals    Frequency    Min 2X/week      PT Plan      Co-evaluation             End of Session Equipment Utilized During Treatment: Gait belt;Oxygen Activity Tolerance: Treatment limited secondary to medical complications (Comment);Other (comment) Patient left: with call bell/phone within reach;in bed     Time: 1115-1131 PT Time Calculation (min) (ACUTE ONLY): 16 min  Charges:   $Therapeutic Activity: 8-22 mins                    G Codes:      Jonathan Howell 2016/11/25, 12:05 PM

## 2016-11-21 NOTE — Care Management Important Message (Signed)
Important Message  Patient Details  Name: Jonathan Howell MRN: [id number] Date of Birth: 07-Jul-1941   Medicare Important Message Given:  Yes    Beverly Sessions, RN 11/21/2016, 5:11 PM

## 2016-11-21 NOTE — Progress Notes (Signed)
[facility] health referral for services of SN and PT received from [place]. Plan was for discharge home today following his biopsy, per discussion with staff RN Merrily Pew, patient is more short of breath than prior to his biopsy and will now not discharge until tomorrow. Writer to follow up with patient in the morning prior to his discharge to discuss [facility] services. CMRN Isaias Cowman aware. Thank you. Flo Shanks RN, BSN, [facility] Liaison [phone] c

## 2016-11-21 NOTE — Discharge Instructions (Signed)
Use oxygen and nebulizer as before

## 2016-11-21 NOTE — Discharge Summary (Signed)
[facility] [place] NAME: Jonathan Howell    MR#:  [id number]  DATE OF BIRTH:  Mar 10, 1941  DATE OF ADMISSION:  11/19/2016 ADMITTING PHYSICIAN: Gladstone Lighter, MD  DATE OF DISCHARGE: 11/21/16  PRIMARY CARE PHYSICIAN: Petra Kuba, MD    ADMISSION DIAGNOSIS:  COPD with acute exacerbation ([place]) [J44.1] Right lower lobe lung mass [R91.8] Community acquired pneumonia of right lower lobe of lung ([place]) [J18.1]  DISCHARGE DIAGNOSIS:  Acute on chronic COPD exacerbation Right LL lung mass---w/u as out pt Post obstructive pneumonia H/o smoking  SECONDARY DIAGNOSIS:   Past Medical History:  Diagnosis Date  . Asthma   . COPD (chronic obstructive pulmonary disease) ([place])   . Hypertension   . Personal history of tobacco use, presenting hazards to health 07/14/2015    HOSPITAL COURSE:  Jonathan Howell a 76 y.o. malewith a known history of COPD not on home oxygen, hypertension, ongoing smoking presents to hospital secondary to worsening shortness of breath and cough for almost 3 weeks now.  #1 Post obstructive pneumonia- likely from lung mass -o2 support - vancomycin and zosyn---d/c vanc since MRSA PCR neg---change to po augmentin -  blood cultures  negative  #2 Right lung mass- current smoker, -Oncology consult appreciated.  -NM body scan, CT abdomen shows mets to the bones and MRI brain ?two lesions suspicious for mets -Further cancer workup as out pt. Dr Janese Banks aware -Patient to get ultrasound-guided biopsy from the supraclavicular area today per interventional radiology  #3 COPD- with exacerbation, IV solumedrol, xopenex nebs  -change to oral steroids o2 support, inhalers -pt need home oxygen  Now  #4 Sinus tachycardia- change nebs, monitor  #5 Tobacco use disorder- counseled, nicotine patch  #6 ARF- -Received IV fluids -Avoid nephrotoxins  #7DVT prophylaxis- started on lovenox-but on hold due to plans  for  biopsy  D/c home later today if remains stable after biopsy. HHPT. Tried to leave msg for dter kimy but no voicemail has been set up  Tried to call dter Sharilyn Sites but   CONSULTS OBTAINED:  Treatment Team:  Sindy Guadeloupe, MD Erby Pian, MD  DRUG ALLERGIES:  No Known Allergies  DISCHARGE MEDICATIONS:   Current Discharge Medication List    START taking these medications   Details  amoxicillin-clavulanate (AUGMENTIN) 875-125 MG tablet Take 1 tablet by mouth every 12 (twelve) hours. Qty: 12 tablet, Refills: 0    levalbuterol (XOPENEX) 1.25 MG/0.5ML nebulizer solution Take 1.25 mg by nebulization every 6 (six) hours. Qty: 1 each, Refills: 12    mometasone-formoterol (DULERA) 200-5 MCG/ACT AERO Inhale 2 puffs into the lungs 2 (two) times daily. Qty: 1 Inhaler, Refills: 0    predniSONE (DELTASONE) 10 MG tablet Take 5 tablets (50 mg total) by mouth daily with breakfast. Start 50 mg daily taper by 10 mg daily then stop Qty: 15 tablet, Refills: 0      CONTINUE these medications which have NOT CHANGED   Details  docusate sodium (COLACE) 100 MG capsule Take 1 capsule (100 mg total) by mouth 2 (two) times daily. Qty: 60 capsule, Refills: 0    QUEtiapine (SEROQUEL) 400 MG tablet Take 1 tablet (400 mg total) by mouth at bedtime. Qty: 30 tablet, Refills: 6    tamsulosin (FLOMAX) 0.4 MG CAPS capsule Take 1 capsule (0.4 mg total) by mouth daily. Qty: 30 capsule, Refills: 0        If you experience worsening of your admission symptoms, develop shortness of breath,  life threatening emergency, suicidal or homicidal thoughts you must seek medical attention immediately by calling 911 or calling your MD immediately  if symptoms less severe.  You Must read complete instructions/literature along with all the possible adverse reactions/side effects for all the Medicines you take and that have been prescribed to you. Take any new Medicines after you have completely understood and accept all  the possible adverse reactions/side effects.   Please note  You were cared for by a hospitalist during your hospital stay. If you have any questions about your discharge medications or the care you received while you were in the hospital after you are discharged, you can call the unit and asked to speak with the hospitalist on call if the hospitalist that took care of you is not available. Once you are discharged, your primary care physician will handle any further medical issues. Please note that NO REFILLS for any discharge medications will be authorized once you are discharged, as it is imperative that you return to your primary care physician (or establish a relationship with a primary care physician if you do not have one) for your aftercare needs so that they can reassess your need for medications and monitor your lab values. Today   SUBJECTIVE   No new complaints  VITAL SIGNS:  Blood pressure (!) 143/105, pulse (!) 119, temperature 98.7 F (37.1 C), temperature source Oral, resp. rate 20, height '5\' 2"'$  (1.575 m), weight 58.4 kg (128 lb 11.2 oz), SpO2 93 %.  I/O:   Intake/Output Summary (Last 24 hours) at 11/21/16 1414 Last data filed at 11/21/16 0542  Gross per 24 hour  Intake              700 ml  Output              550 ml  Net              150 ml    PHYSICAL EXAMINATION:  GENERAL:  76 y.o.-year-old patient lying in the bed with no acute distress. thin EYES: Pupils equal, round, reactive to light and accommodation. No scleral icterus. Extraocular muscles intact.  HEENT: Head atraumatic, normocephalic. Oropharynx and nasopharynx clear.  NECK:  Supple, no jugular venous distention. No thyroid enlargement, no tenderness.  LUNGS:distant breath sounds bilaterally, no wheezing, rales,rhonchi or crepitation. No use of accessory muscles of respiration.  CARDIOVASCULAR: S1, S2 normal. No murmurs, rubs, or gallops.  ABDOMEN: Soft, non-tender, non-distended. Bowel sounds present. No  organomegaly or mass.  EXTREMITIES: No pedal edema, cyanosis, or clubbing.  NEUROLOGIC: Cranial nerves II through XII are intact. Muscle strength 5/5 in all extremities. Sensation intact. Gait not checked.  PSYCHIATRIC: The patient is alert and oriented x 3.  SKIN: No obvious rash, lesion, or ulcer.   DATA REVIEW:   CBC   Recent Labs Lab 11/20/16 0600  WBC 10.2  HGB 9.1*  HCT 26.3*  PLT 204    Chemistries   Recent Labs Lab 11/20/16 0600  NA 137  K 4.2  CL 107  CO2 22  GLUCOSE 135*  BUN 35*  CREATININE 1.39*  CALCIUM 8.2*    Microbiology Results   Recent Results (from the past 240 hour(s))  Blood culture (routine x 2)     Status: None (Preliminary result)   Collection Time: 11/19/16 10:15 AM  Result Value Ref Range Status   Specimen Description BLOOD R FA  Final   Special Requests BOTTLES DRAWN AEROBIC AND ANAEROBIC BCAV  Final   Culture  NO GROWTH 2 DAYS  Final   Report Status PENDING  Incomplete  Blood culture (routine x 2)     Status: None (Preliminary result)   Collection Time: 11/19/16 10:25 AM  Result Value Ref Range Status   Specimen Description BLOOD L FA  Final   Special Requests   Final    BOTTLES DRAWN AEROBIC AND ANAEROBIC AER/BCHV ANA/BCAV   Culture NO GROWTH 2 DAYS  Final   Report Status PENDING  Incomplete  MRSA PCR Screening     Status: None   Collection Time: 11/19/16  4:15 PM  Result Value Ref Range Status   MRSA by PCR NEGATIVE NEGATIVE Final    Comment:        The GeneXpert MRSA Assay (FDA approved for NASAL specimens only), is one component of a comprehensive MRSA colonization surveillance program. It is not intended to diagnose MRSA infection nor to guide or monitor treatment for MRSA infections.     RADIOLOGY:  Mr Jeri Cos Wo Contrast  Result Date: 11/20/2016 CLINICAL DATA:  76 year old male with increasing cough and shortness of Breath. Chest imaging shows evidence of right lung mass, hilar tumor, and pulmonary metastatic  disease. EXAM: MRI HEAD WITHOUT AND WITH CONTRAST TECHNIQUE: Multiplanar, multiecho pulse sequences of the brain and surrounding structures were obtained without and with intravenous contrast. CONTRAST:  44m MULTIHANCE GADOBENATE DIMEGLUMINE 529 MG/ML IV SOLN COMPARISON:  Brain MRI without contrast 05/08/2016 FINDINGS: Brain: No abnormal or suspicious enhancement of the brain on axial or coronal postcontrast images. However, on sagittal post-contrast images (series 13 images 11 and 17) there are 2 small 1-2 mm foci of nodular enhancement. I favor these are vascular related rather than very small brain metastases. There is pulsation artifact in the posterior fossa on sagittal and coronal post-contrast images. No dural thickening. No restricted diffusion to suggest acute infarction. No midline shift, mass effect, evidence of mass lesion, ventriculomegaly, extra-axial collection or acute intracranial hemorrhage. Pituitary within normal limits. Nonspecific cerebral white matter T2 and FLAIR hyperintensity is mild to moderate for age. No cortical encephalomalacia or chronic cerebral blood products. There are several tiny chronic lacunar infarcts in the cerebellar hemispheres, more so the left. Vascular: Major intracranial vascular flow voids are stable and within normal limits. Skull and upper cervical spine: Advanced degenerative changes in the cervical spine including ligamentous hypertrophy about the odontoid and multilevel spondylolisthesis. There is some associated degenerative cervicomedullary junction and spinal stenosis but otherwise negative visualized cervical spinal cord. Visualized bone marrow signal is within normal limits. Sinuses/Orbits: Negative orbits soft tissues. Right frontal and ethmoid sinus mucosal thickening and bubbly opacity. Other Visualized paranasal sinuses and mastoids are well pneumatized. Other: Visible internal auditory structures appear normal. Negative scalp soft tissues. IMPRESSION:  1. Difficult to exclude two very early brain metastases in both superior frontal lobes, but favor instead benign vascular related enhancement. Repeat Brain MRI without and with contrast recommended in 6-8 weeks (ideally an SRS protocol study performed on a 3 Tesla MRI unit). 2. Otherwise no metastatic disease or acute intracranial abnormality. 3. Advanced cervical spine degeneration. Electronically Signed   By: HGenevie AnnM.D.   On: 11/20/2016 10:47   Nm Bone Scan Whole Body  Result Date: 11/20/2016 CLINICAL DATA:  History of lung mass EXAM: NUCLEAR MEDICINE WHOLE BODY BONE SCAN TECHNIQUE: Whole body anterior and posterior images were obtained approximately 3 hours after intravenous injection of radiopharmaceutical. RADIOPHARMACEUTICALS:  23.38 mCi Technetium-979mDP IV COMPARISON:  None. FINDINGS: There is adequate uptake  of radioactive tracer throughout the bony skeleton. Bilateral renal activity is noted. Scattered areas of increased activity are identified in the rib cage bilaterally both anteriorly and posteriorly as well as within the iliac bones adjacent to the sacroiliac joint as well as the left ischial tuberosity. Changes in the sternum and to a lesser degree in the thoracic and lumbar spine are noted. These areas are consistent with metastatic disease. Degenerative changes are noted in the knee joints and tarsal bones as well as the shoulder joints bilaterally. Changes of prior hip replacement are noted. IMPRESSION: Diffuse osseous metastatic disease as described. Electronically Signed   By: Inez Catalina M.D.   On: 11/20/2016 11:50   Ct Abdomen Pelvis W Contrast  Result Date: 11/20/2016 CLINICAL DATA:  Abnormal CT chest demonstrating 5 cm RIGHT lower lobe mass with confluent hilar/mediastinal adenopathy, pulmonary nodules, and interstitial infiltrates question lymphangitic spread of tumor EXAM: CT ABDOMEN AND PELVIS WITH CONTRAST TECHNIQUE: Multidetector CT imaging of the abdomen and pelvis was  performed using the standard protocol following bolus administration of intravenous contrast. Sagittal and coronal MPR images reconstructed from axial data set. CONTRAST:  29m ISOVUE-300 IOPAMIDOL (ISOVUE-300) INJECTION 61% IV. Dilute oral contrast. COMPARISON:  CT abdomen pelvis 05/06/2016 FINDINGS: Lower chest: 5.3 x 3.9 cm RIGHT lower lobe mass with confluent hilar and infrahilar adenopathy. Diffuse interstitial infiltrates throughout the visualized RIGHT middle and RIGHT lower lobes question lymphangitic tumor spread. LEFT basilar pulmonary nodule 9 mm diameter. Central peribronchial thickening. Hepatobiliary: Depending calculi within gallbladder. Liver and gallbladder otherwise normal appearance. Pancreas: Normal appearance Spleen: Normal appearance Adrenals/Urinary Tract: Mild diffuse low attenuation nodular thickening of both adrenal glands unchanged. Tiny nonobstructing LEFT renal calculi. BILATERAL renal cortical thinning. Deformity of the lateral margin of the LEFT kidney, new since previous exam likely representing a small subcapsular fluid collection 2.0 x 1.1 cm image 33. No hydronephrosis or hydroureter. Bladder poorly visualized due to beam hardening artifacts in pelvis, though bladder contains excreted contrast material. Prostate gland obscured. Stomach/Bowel: Normal appendix. Descending and sigmoid diverticulosis. Portions of pelvic bowel loops obscured by beam hardening artifacts. Stomach and bowel loops otherwise unremarkable. Vascular/Lymphatic: Atherosclerotic calcifications aorta and iliac arteries. Significant calcified plaque at the origins of the renal arteries bilaterally question renal artery stenosis. Additional significant plaque at the origins of the SMA and IMA. Coronary toe calcifications. Mitral annular calcifications. No definite adenopathy. Reproductive: N/A Other: No free air free fluid. Musculoskeletal: Significant beam hardening artifacts in pelvis from BILATERAL hip  prostheses. Osseous demineralization. Degenerative disc disease changes lumbar spine. No acute bony findings. IMPRESSION: RIGHT lower lobe neoplasm with extensive RIGHT hilar and infrahilar adenopathy as well as probable lymphangitic tumor spread throughout RIGHT lung. LEFT lower lobe pulmonary nodule. No definite intra-abdominal or intrapelvic metastases. Cholelithiasis. Aortic atherosclerosis and coronary arterial calcifications with significant stenoses at the origins of the renal arteries and SMA. Limitations of pelvic imaging secondary to beam hardening artifacts from BILATERAL hip prostheses. Electronically Signed   By: MLavonia DanaM.D.   On: 11/20/2016 11:58     Management plans discussed with the patient, family and they are in agreement.  CODE STATUS:     Code Status Orders        Start     Ordered   11/19/16 1533  Full code  Continuous     11/19/16 1532    Code Status History    Date Active Date Inactive Code Status Order ID Comments User Context   05/07/2016  5:21 AM 05/09/2016  3:43 PM Full Code [id number]  Harvie Bridge, DO Inpatient      TOTAL TIME TAKING CARE OF THIS PATIENT: 40 minutes.    Galen Russman M.D on 11/21/2016 at 2:14 PM  Between 7am to 6pm - Pager - [phone] After 6pm go to [email or web address] - password [facility]  Office  [phone]  CC: Primary care physician; Petra Kuba, MD

## 2016-11-21 NOTE — Procedures (Signed)
Interventional Radiology Procedure Note  Procedure:  US guided core biopsy of right supraclavicular lymph node  Complications: None  Estimated Blood Loss: < 10 mL  1 cm abnormal looking right supraclavicular LN sampled with 18 G core biopsy x 4.  Jonathan Howell. Kathlene Cote, M.D Pager:  [phone]

## 2016-11-21 NOTE — Progress Notes (Signed)
Pt was to be discharged today per Md order with [facility]. Pt returned from biopsy and was very SOB and has labored breathing. SAts were in the upper 80's increased oxygen to 4 L via [place]. MD orders to cancel d/c since pt is notably more SOB. Will likely discharge in AM and monitor closely overnight.

## 2016-11-21 NOTE — Progress Notes (Signed)
SATURATION QUALIFICATIONS: (This note is used to comply with regulatory documentation for home oxygen)  Patient Saturations on Room Air at Rest = 89%  Patient Saturations on Room Air with excertion = 79%  Patient Saturations on 6 Liters of oxygen while Ambulating = 91%  Please briefly explain why patient needs home oxygen:

## 2016-11-22 LAB — BLOOD GAS, ARTERIAL
ACID-BASE DEFICIT: 1.6 mmol/L (ref 0.0–2.0)
BICARBONATE: 22.8 mmol/L (ref 20.0–28.0)
FIO2: 0.44
O2 SAT: 95.2 %
PATIENT TEMPERATURE: 37
PO2 ART: 76 mmHg — AB (ref 83.0–108.0)
pCO2 arterial: 36 mmHg (ref 32.0–48.0)
pH, Arterial: 7.41 (ref 7.350–7.450)

## 2016-11-22 MED ORDER — HEPARIN SODIUM (PORCINE) 5000 UNIT/ML IJ SOLN
5000.0000 [IU] | Freq: Three times a day (TID) | INTRAMUSCULAR | Status: DC
  Administered 2016-11-22: 5000 [IU] via SUBCUTANEOUS
  Filled 2016-11-22: qty 1

## 2016-11-22 NOTE — Progress Notes (Signed)
Pt d/c to home today. IV removed intact.  Rx's given to pt w/all questions and concerns addressed.  D/C paperwork reviewed and education provided with all questions and concerns addressed.  Pt daughter at bedside for home transport.  Volunteer services contact for transportation from room to exit.

## 2016-11-22 NOTE — Progress Notes (Signed)
Visit made to new [place] referral admitted on 2/6 with post obstructive pneumonia, now with suspected lung mass. Patient is currently requiring 6 liters of oxygen, plan is for discharge home via [place] today. Patient lives with his daughter, Doreene Nest, grandson and nephew. Staff RN Kenney Houseman reports some periods of confusion today. Patient seen lying in bed, alert and interactive, some dyspnea noted with conversation. [facility] explained. Writer explained to patient that the plan is for the [facility] Rn to contact him over the weekend. Phone numbers and address verified. Patient reports that both phone numbers listed are for his daughter and that it would be fine to contact her for the admission. He reports he has a rolling walker and and wheel chair in the home, oxygen to be provided by [facility]. Patient information faxed to referral. Thank you. Flo Shanks RN, BSN, [place], hospital liaison [phone] c

## 2016-11-22 NOTE — Care Management (Signed)
Patient home O2 order has been increased to 6 liters.  Corene Cornea with [facility] has been notified.  Santiago Glad [facility] has has notified me that case will be opened this weekend versus Monday.  Patient to discharge today.  Daughter to transport.  RNCM signing off.

## 2016-11-22 NOTE — Progress Notes (Signed)
[place] [place] at [place] NAME: Jonathan Howell    MR#:  [id number]  DATE OF BIRTH:  1941/09/16  SUBJECTIVE:   Status post ultrasound-guided biopsy of the right supraclavicular lymph node. Patient was going to be discharged yesterday but became quite hypoxic and currently on 6 L nasal cannula. Patient has significant hypoxemia on minimal exertion. Denies any chest pain, nausea, vomiting, hemoptysis.  REVIEW OF SYSTEMS:   Review of Systems  Constitutional: Negative for chills, fever and weight loss.  HENT: Negative for ear discharge, ear pain and nosebleeds.   Eyes: Negative for blurred vision, pain and discharge.  Respiratory: Positive for cough, sputum production and shortness of breath. Negative for wheezing and stridor.   Cardiovascular: Negative for chest pain, palpitations, orthopnea and PND.  Gastrointestinal: Negative for abdominal pain, diarrhea, nausea and vomiting.  Genitourinary: Negative for frequency and urgency.  Musculoskeletal: Negative for back pain and joint pain.  Neurological: Negative for sensory change, speech change, focal weakness and weakness.  Psychiatric/Behavioral: Negative for depression and hallucinations. The patient is not nervous/anxious.    Tolerating Diet: Yes Tolerating PT: Ambulatory  DRUG ALLERGIES:  No Known Allergies  VITALS:  Blood pressure 119/77, pulse (!) 125, temperature 98 F (36.7 C), temperature source Oral, resp. rate (!) 24, height '5\' 2"'$  (1.575 m), weight 58.4 kg (128 lb 11.2 oz), SpO2 95 %.  PHYSICAL EXAMINATION:   Physical Exam  GENERAL:  77 y.o.-year-old patient lying in the bed in mild resp. Distress.   EYES: Pupils equal, round, reactive to light and accommodation. No scleral icterus. Extraocular muscles intact.  HEENT: Head atraumatic, normocephalic. Oropharynx and nasopharynx clear.  NECK:  Supple, no jugular venous distention. No thyroid enlargement, no tenderness.  LUNGS:  Prolonged Insp. & Exp. phase, no wheezing, rales, rhonchi. No use of accessory muscles of respiration.  CARDIOVASCULAR: S1, S2 normal. No murmurs, rubs, or gallops.  ABDOMEN: Soft, nontender, nondistended. Bowel sounds present. No organomegaly or mass.  EXTREMITIES: No cyanosis, clubbing or edema b/l.    NEUROLOGIC: Cranial nerves II through XII are intact. No focal Motor or sensory deficits b/l.   PSYCHIATRIC:  patient is alert and oriented x 3.  SKIN: No obvious rash, lesion, or ulcer.   LABORATORY PANEL:  CBC  Recent Labs Lab 11/20/16 0600  WBC 10.2  HGB 9.1*  HCT 26.3*  PLT 204    Chemistries   Recent Labs Lab 11/20/16 0600  NA 137  K 4.2  CL 107  CO2 22  GLUCOSE 135*  BUN 35*  CREATININE 1.39*  CALCIUM 8.2*   Cardiac Enzymes  Recent Labs Lab 11/19/16 1018  TROPONINI <0.03   RADIOLOGY:  US Biopsy  Result Date: 11/21/2016 CLINICAL DATA:  Right lower lobe lung mass with metastatic lymphadenopathy to the mediastinum, right hilum and right supraclavicular region by prior imaging. EXAM: ULTRASOUND GUIDED CORE BIOPSY OF RIGHT SUPRACLAVICULAR LYMPH NODE MEDICATIONS: 0.5 mg IV Versed; 25 mcg IV Fentanyl Total Moderate Sedation Time: 17 minutes. The patient's level of consciousness and physiologic status were continuously monitored during the procedure by Radiology nursing. PROCEDURE: The procedure, risks, benefits, and alternatives were explained to the patient. Questions regarding the procedure were encouraged and answered. The patient understands and consents to the procedure. A time out was performed prior to initiating the procedure. The right neck was prepped with chlorhexidine in a sterile fashion, and a sterile drape was applied covering the operative field. A sterile gown and sterile gloves were used  for the procedure. Local anesthesia was provided with 1% Lidocaine. Right supraclavicular/lower cervical lymph nodes were localized. Four 18 gauge core biopsy samples were  obtained through a supraclavicular lymph node. COMPLICATIONS: None. FINDINGS: Three abnormal appearing right-sided supraclavicular lymph nodes are identified. The largest measures approximately 1.1 cm. Core biopsy was obtained through this lymph node. IMPRESSION: Ultrasound-guided core biopsy performed of an abnormal right supraclavicular lymph node. Electronically Signed   By: Aletta Edouard M.D.   On: 11/21/2016 17:07   ASSESSMENT AND PLAN:  Jonathan Howell  is a 76 y.o. male with a known history of COPD not on home oxygen, hypertension, ongoing smoking presents to hospital secondary to worsening shortness of breath and cough for almost 3 weeks now.  #1 Post obstructive pneumonia- likely due lung mass - was on IV Zosyn, VAnc but off IV abx now.  - transitioned to oral Augmentin and will cont.  -  blood cultures  negative  #2 Right lung mass- current smoker, -Oncology consult appreciated.  -NM body scan, CT abdomen shows mets to the bones and MRI brain ?two lesions suspicious for mets -Further cancer workup as out pt - s/p US guided lymph node biopsy, follow up with oncology as outpatient.  #3 COPD- with exacerbation, transitioned of IV steroid to Oral Pred.   - cont. Dulera, Xopenex nebs.   #4 Sinus tachycardia- due to resp. Distress.   #5. AMS - etiology unclear.  Related to hypoxia, steroids.  - will cont. To monitor. ABG showing no hypercarbia  #6. Tobacco use disorder- counseled, nicotine patch  #6 ARF-improved w/ IV fluids and will monitor.    Case discussed with Care Management/Social Worker. Management plans discussed with the patient, family and they are in agreement.  CODE STATUS: Full  DVT Prophylaxis: Hep. SQ TOTAL TIME TAKING CARE OF THIS PATIENT: 30 minutes.      Note: This dictation was prepared with Dragon dictation along with smaller phrase technology. Any transcriptional errors that result from this process are unintentional.  Henreitta Leber M.D on  11/22/2016 at 3:29 PM  Between 7am to 6pm - Pager - [phone]  After 6pm go to [email or web address] - password [facility]  Office  [phone]  CC: Primary care physician; Petra Kuba, MD

## 2016-11-24 LAB — CULTURE, BLOOD (ROUTINE X 2)
Culture: NO GROWTH
Culture: NO GROWTH

## 2016-11-26 ENCOUNTER — Inpatient Hospital Stay: Payer: Medicare Other

## 2016-11-26 ENCOUNTER — Encounter: Payer: Self-pay | Admitting: Oncology

## 2016-11-26 ENCOUNTER — Inpatient Hospital Stay: Payer: Medicare Other | Attending: Oncology | Admitting: Oncology

## 2016-11-26 VITALS — BP 98/65 | HR 120 | Temp 96.2°F | Resp 22 | Wt 123.5 lb

## 2016-11-26 VITALS — BP 136/92 | HR 114 | Resp 26

## 2016-11-26 DIAGNOSIS — F17210 Nicotine dependence, cigarettes, uncomplicated: Secondary | ICD-10-CM | POA: Insufficient documentation

## 2016-11-26 DIAGNOSIS — J440 Chronic obstructive pulmonary disease with acute lower respiratory infection: Secondary | ICD-10-CM | POA: Insufficient documentation

## 2016-11-26 DIAGNOSIS — I959 Hypotension, unspecified: Secondary | ICD-10-CM

## 2016-11-26 DIAGNOSIS — C7800 Secondary malignant neoplasm of unspecified lung: Secondary | ICD-10-CM | POA: Insufficient documentation

## 2016-11-26 DIAGNOSIS — Z79899 Other long term (current) drug therapy: Secondary | ICD-10-CM | POA: Insufficient documentation

## 2016-11-26 DIAGNOSIS — Z7189 Other specified counseling: Secondary | ICD-10-CM | POA: Insufficient documentation

## 2016-11-26 DIAGNOSIS — C3431 Malignant neoplasm of lower lobe, right bronchus or lung: Secondary | ICD-10-CM

## 2016-11-26 DIAGNOSIS — C3491 Malignant neoplasm of unspecified part of right bronchus or lung: Secondary | ICD-10-CM

## 2016-11-26 DIAGNOSIS — C771 Secondary and unspecified malignant neoplasm of intrathoracic lymph nodes: Secondary | ICD-10-CM | POA: Insufficient documentation

## 2016-11-26 DIAGNOSIS — C7951 Secondary malignant neoplasm of bone: Secondary | ICD-10-CM | POA: Insufficient documentation

## 2016-11-26 DIAGNOSIS — C3490 Malignant neoplasm of unspecified part of unspecified bronchus or lung: Secondary | ICD-10-CM | POA: Insufficient documentation

## 2016-11-26 DIAGNOSIS — C7801 Secondary malignant neoplasm of right lung: Secondary | ICD-10-CM

## 2016-11-26 DIAGNOSIS — C7931 Secondary malignant neoplasm of brain: Secondary | ICD-10-CM | POA: Insufficient documentation

## 2016-11-26 DIAGNOSIS — J189 Pneumonia, unspecified organism: Secondary | ICD-10-CM

## 2016-11-26 DIAGNOSIS — I10 Essential (primary) hypertension: Secondary | ICD-10-CM | POA: Insufficient documentation

## 2016-11-26 MED ORDER — FOLIC ACID 1 MG PO TABS: 1.0000 mg | ORAL_TABLET | Freq: Every day | ORAL | 3 refills | Status: AC

## 2016-11-26 MED ORDER — CYANOCOBALAMIN 1000 MCG/ML IJ SOLN: 1000.0000 ug | Freq: Once | INTRAMUSCULAR | 0 refills | Status: DC

## 2016-11-26 MED ORDER — SODIUM CHLORIDE 0.9 % IV SOLN
INTRAVENOUS | Status: DC
  Administered 2016-11-26: 16:00:00 via INTRAVENOUS
  Filled 2016-11-26 (×2): qty 1000

## 2016-11-26 MED ORDER — CYANOCOBALAMIN 1000 MCG/ML IJ SOLN
1000.0000 ug | Freq: Once | INTRAMUSCULAR | Status: AC
  Administered 2016-11-26: 1000 ug via INTRAMUSCULAR
  Filled 2016-11-26: qty 1

## 2016-11-26 NOTE — Progress Notes (Signed)
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 Patient is a 76 yr old who has a h/o COPD not on home oxygen, HTN who presented to the hospital with worsening SOB that has been ongoing for 3 weeks ago. CXR showed possible pneumonia.   2. CT thorax on 11/19/16 showed: 1. 5 cm right lower lobe lung mass with confluent right hilar and infrahilar tumor and ipsilateral and contralateral mediastinal lymphadenopathy. Obstructive pneumonia and probable interstitial spread of tumor. Pulmonary metastatic disease.Marland Kitchen No obvious upper abdominal metastatic disease or osseousmetastatic disease. Severe underlying emphysema. Bilateral adrenal gland nodules, unchanged since 2016 and likely adenomas  3. CT abdomen did not show any definite intra-abdominal or intrapelvic metastases  4. Bone scan showed diffuse osseous metastatic disease. Scattered areas of increased activity are identified in the rib cage bilaterally both anteriorly and posteriorly as well as within the iliac bones adjacent to the sacroiliac joint as well as the left ischial tuberosity. Changes in the sternum and to a lesser degree in the thoracic and lumbar spine are noted. These areas are consistent with metastatic disease.   5. MRI Brain: 1. Difficult to exclude two very early brain metastases in both superior frontal lobes, but favor instead benign vascular related enhancement. Repeat Brain MRI without and with contrast recommended in 6-8 weeks (ideally an SRS protocol study performed on a 3 Tesla MRI unit). 2. Otherwise no metastatic disease or acute  intracranial abnormality. 3. Advanced cervical spine degeneration.   6. A. 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  Molecular testing is pending   Interval history- patient has been feeling quite weak since his hospital discharge. He needs some assistance with his ADLs now. He reports shortness of breath on minimal exertion. Denies any pain  ECOG PS- 2 Pain scale- 0 Opioid associated constipation- no  Review of systems- Review of Systems  Constitutional: Positive for malaise/fatigue and weight loss. Negative for chills and fever.  HENT: Negative for congestion, ear discharge and nosebleeds.   Eyes: Negative for blurred vision.  Respiratory: Positive for shortness of breath. Negative for cough, hemoptysis, sputum production and wheezing.   Cardiovascular: Negative for chest pain, palpitations, orthopnea and claudication.  Gastrointestinal: Negative for abdominal pain, blood in stool, constipation, diarrhea, heartburn, melena, nausea and vomiting.  Genitourinary: Negative for dysuria, flank pain, frequency, hematuria and urgency.  Musculoskeletal: Negative for back pain, joint pain and myalgias.  Skin: Negative for rash.  Neurological: Negative for dizziness, tingling, focal weakness, seizures, weakness and headaches.  Endo/Heme/Allergies: Does not bruise/bleed easily.  Psychiatric/Behavioral: Negative for depression and suicidal ideas. The patient does not have insomnia.      Current treatment- yet to start  No Known Allergies   Past Medical History:  Diagnosis Date  . Asthma   . COPD (chronic obstructive pulmonary disease) ([place])   . Hypertension   . Personal history of tobacco use, presenting hazards to health 07/14/2015     Past Surgical History:  Procedure Laterality Date  . EXTRACORPOREAL SHOCK WAVE LITHOTRIPSY Left 05/16/2016   Procedure: EXTRACORPOREAL SHOCK WAVE LITHOTRIPSY (ESWL);  Surgeon: Hollice Espy, MD;  Location: [facility] ORS;  Service: Urology;  Laterality: Left;  . HEMORRHOID SURGERY      Social History   Social History  . Marital status: Widowed    Spouse name: N/A  . Number of children: N/A  . Years of education: N/A   Occupational History  . Not on file.   Social History Main Topics  . Smoking status: Heavy Tobacco Smoker  . Smokeless tobacco: Never Used     Comment: 1 pack per day  . Alcohol use No  . Drug use: No  . Sexual activity: Yes    Birth control/ protection: None   Other Topics Concern  . Not on file   Social History Narrative   Lives with family. Independent at baseline.    Family History  Problem Relation Age of Onset  . Breast cancer Mother   . Suicidality Father      Current Outpatient Prescriptions:  .  amoxicillin-clavulanate (AUGMENTIN) 875-125 MG tablet, Take 1 tablet by mouth every 12 (twelve) hours., Disp: 12 tablet, Rfl: 0 .  docusate sodium (COLACE) 100 MG capsule, Take 1 capsule (100 mg total) by mouth 2 (two) times daily., Disp: 60 capsule, Rfl: 0 .  levalbuterol (XOPENEX) 1.25 MG/0.5ML nebulizer solution, Take 1.25 mg by nebulization every 6 (six) hours., Disp: 1 each, Rfl: 12 .  mometasone-formoterol (DULERA) 200-5 MCG/ACT AERO, Inhale 2 puffs into the lungs 2 (two) times daily., Disp: 1 Inhaler, Rfl: 0 .  predniSONE (DELTASONE) 10 MG tablet, Take 5 tablets (50 mg total) by mouth daily with breakfast. Start 50 mg daily taper by 10 mg daily then stop, Disp: 15 tablet, Rfl: 0 .  QUEtiapine (SEROQUEL) 400 MG tablet, Take 1 tablet (400 mg total) by mouth at bedtime., Disp: 30 tablet, Rfl: 6 .  tamsulosin (FLOMAX) 0.4 MG CAPS capsule, Take 1 capsule (0.4 mg total) by mouth daily., Disp: 30 capsule, Rfl: 0  Physical exam:  Vitals:   11/26/16 1442 11/26/16 1457  BP: 98/65   Pulse: (!) 120   Resp: (!) 22   Temp: (!) 96.2 F (35.7 C)   TempSrc: Tympanic   SpO2:  (!) 63%  Weight: 123 lb 7.3 oz (56 kg)    Physical Exam    Constitutional: He is oriented to person, place, and time.  Thin, in no acute distress  HENT:  Head: Normocephalic and atraumatic.  Eyes: EOM are normal. Pupils are equal, round, and reactive to light.  Neck: Normal range of motion.  Cardiovascular: Regular rhythm and normal heart sounds.   tachycardic  Pulmonary/Chest: Effort normal and breath sounds normal.  Abdominal: Soft. Bowel sounds are normal.  Neurological: He is alert and oriented to person, place, and time.  Skin: Skin is warm and dry.     CMP Latest Ref Rng & Units 11/20/2016  Glucose 65 - 99 mg/dL 135(H)  BUN 6 - 20 mg/dL 35(H)  Creatinine 0.61 - 1.24 mg/dL 1.39(H)  Sodium 135 - 145 mmol/L 137  Potassium 3.5 - 5.1 mmol/L 4.2  Chloride 101 - 111 mmol/L 107  CO2 22 - 32 mmol/L 22  Calcium 8.9 - 10.3 mg/dL 8.2(L)  Total Protein 6.5 - 8.1 g/dL -  Total Bilirubin 0.3 - 1.2 mg/dL -  Alkaline Phos 38 - 126 U/L -  AST 15 - 41 U/L -  ALT 17 - 63 U/L -   CBC Latest Ref Rng & Units 11/20/2016  WBC 3.8 - 10.6 K/uL 10.2  Hemoglobin 13.0 - 18.0 g/dL 9.1(L)  Hematocrit 40.0 - 52.0 % 26.3(L)  Platelets 150 - 440 K/uL 204    No images are attached to the encounter.  Ct Chest W Contrast  Result Date: 11/19/2016 CLINICAL DATA:  Cough and shortness of breath for 3 weeks. EXAM: CT CHEST WITH CONTRAST TECHNIQUE: Multidetector CT imaging of the chest was performed during intravenous contrast administration. CONTRAST:  36m ISOVUE-300 IOPAMIDOL (ISOVUE-300) INJECTION 61% COMPARISON:  Chest x-ray 11/19/2016 and chest CT 02/28/2015 FINDINGS: Chest wall: No chest wall mass or axillary adenopathy. 8.5 mm supraclavicular node noted on the right side on image 4. The thyroid gland appears normal. Cardiovascular: The heart is normal in size. No pericardial effusion. Scattered aortic calcifications but no aneurysm or dissection. Advanced three-vessel coronary artery calcifications. Mediastinum/Nodes: Extensive mediastinal and right hilar/ infrahilar  lymphadenopathy. 14 mm pretracheal node on image number 53. 3.7 x 2.3 cm right hilar adenopathy. 17 mm subcarinal lymph node. 11 mm left infrahilar lymph node. Extensive tumor surrounding the right middle lobe pulmonary artery, main pulmonary artery and right lower lobe pulmonary artery and right lower lobe and middle lobe bronchi. The esophagus is grossly normal. Lungs/Pleura: 5 x 3.5 cm right lower lobe lung mass with adjacent confirm 1 infrahilar and hilar tumor. There is also obstructive pneumonitis and probable interstitial spread of tumor. Multiple bilateral pulmonary nodules, likely metastatic disease. 8 mm left lower lobe pulmonary nodule on image number 108 was present on the prior CT scan from 2016 but is slightly enlarged. This could be a synchronous primary. Upper Abdomen: Bilateral adrenal gland nodules appears stable since 2016. No obvious hepatic metastatic lesions Musculoskeletal: No significant bony findings. No obvious bone lesions to suggest metastatic disease. IMPRESSION: 1. 5 cm right lower lobe lung mass with confluent right hilar and infrahilar tumor and ipsilateral and contralateral mediastinal lymphadenopathy. 2. Obstructive pneumonia and probable interstitial spread of tumor. 3. Pulmonary metastatic disease. 4. No obvious upper abdominal metastatic disease or osseous metastatic disease. 5. Severe underlying emphysema. 6. Bilateral adrenal gland nodules, unchanged since 2016 and likely adenomas. Electronically Signed   By: PMarijo SanesM.D.   On: 11/19/2016 12:16   Mr BJeri CosWXQContrast  Result Date: 11/20/2016 CLINICAL DATA:  76year old male with increasing cough and shortness of Breath. Chest imaging shows evidence of right lung mass, hilar tumor, and pulmonary metastatic disease. EXAM: MRI HEAD WITHOUT AND WITH CONTRAST TECHNIQUE: Multiplanar, multiecho pulse sequences of the brain and surrounding structures were obtained without and with intravenous contrast. CONTRAST:  197m MULTIHANCE GADOBENATE DIMEGLUMINE 529 MG/ML IV SOLN COMPARISON:  Brain MRI without contrast 05/08/2016 FINDINGS: Brain: No abnormal or suspicious enhancement of the brain on axial or coronal postcontrast images. However, on sagittal post-contrast images (series 13 images 11 and 17) there are 2 small 1-2 mm foci of nodular enhancement. I favor these are vascular related rather than very small brain metastases. There is pulsation artifact in the posterior fossa on sagittal and coronal post-contrast images. No dural thickening. No restricted diffusion to suggest acute infarction. No midline shift, mass effect, evidence of mass lesion, ventriculomegaly, extra-axial collection or acute intracranial hemorrhage. Pituitary within normal limits. Nonspecific cerebral white matter T2 and FLAIR hyperintensity is mild to moderate for age. No cortical encephalomalacia or chronic cerebral blood products. There are several tiny chronic lacunar infarcts in the cerebellar hemispheres, more so the left. Vascular: Major intracranial vascular flow voids are stable and within normal limits. Skull and upper cervical spine: Advanced degenerative changes in the cervical spine including ligamentous hypertrophy  about the odontoid and multilevel spondylolisthesis. There is some associated degenerative cervicomedullary junction and spinal stenosis but otherwise negative visualized cervical spinal cord. Visualized bone marrow signal is within normal limits. Sinuses/Orbits: Negative orbits soft tissues. Right frontal and ethmoid sinus mucosal thickening and bubbly opacity. Other Visualized paranasal sinuses and mastoids are well pneumatized. Other: Visible internal auditory structures appear normal. Negative scalp soft tissues. IMPRESSION: 1. Difficult to exclude two very early brain metastases in both superior frontal lobes, but favor instead benign vascular related enhancement. Repeat Brain MRI without and with contrast recommended in 6-8 weeks  (ideally an SRS protocol study performed on a 3 Tesla MRI unit). 2. Otherwise no metastatic disease or acute intracranial abnormality. 3. Advanced cervical spine degeneration. Electronically Signed   By: Genevie Ann M.D.   On: 11/20/2016 10:47   Nm Bone Scan Whole Body  Result Date: 11/20/2016 CLINICAL DATA:  History of lung mass EXAM: NUCLEAR MEDICINE WHOLE BODY BONE SCAN TECHNIQUE: Whole body anterior and posterior images were obtained approximately 3 hours after intravenous injection of radiopharmaceutical. RADIOPHARMACEUTICALS:  23.38 mCi Technetium-59mMDP IV COMPARISON:  None. FINDINGS: There is adequate uptake of radioactive tracer throughout the bony skeleton. Bilateral renal activity is noted. Scattered areas of increased activity are identified in the rib cage bilaterally both anteriorly and posteriorly as well as within the iliac bones adjacent to the sacroiliac joint as well as the left ischial tuberosity. Changes in the sternum and to a lesser degree in the thoracic and lumbar spine are noted. These areas are consistent with metastatic disease. Degenerative changes are noted in the knee joints and tarsal bones as well as the shoulder joints bilaterally. Changes of prior hip replacement are noted. IMPRESSION: Diffuse osseous metastatic disease as described. Electronically Signed   By: MInez CatalinaM.D.   On: 11/20/2016 11:50   Ct Abdomen Pelvis W Contrast  Result Date: 11/20/2016 CLINICAL DATA:  Abnormal CT chest demonstrating 5 cm RIGHT lower lobe mass with confluent hilar/mediastinal adenopathy, pulmonary nodules, and interstitial infiltrates question lymphangitic spread of tumor EXAM: CT ABDOMEN AND PELVIS WITH CONTRAST TECHNIQUE: Multidetector CT imaging of the abdomen and pelvis was performed using the standard protocol following bolus administration of intravenous contrast. Sagittal and coronal MPR images reconstructed from axial data set. CONTRAST:  711mISOVUE-300 IOPAMIDOL (ISOVUE-300)  INJECTION 61% IV. Dilute oral contrast. COMPARISON:  CT abdomen pelvis 05/06/2016 FINDINGS: Lower chest: 5.3 x 3.9 cm RIGHT lower lobe mass with confluent hilar and infrahilar adenopathy. Diffuse interstitial infiltrates throughout the visualized RIGHT middle and RIGHT lower lobes question lymphangitic tumor spread. LEFT basilar pulmonary nodule 9 mm diameter. Central peribronchial thickening. Hepatobiliary: Depending calculi within gallbladder. Liver and gallbladder otherwise normal appearance. Pancreas: Normal appearance Spleen: Normal appearance Adrenals/Urinary Tract: Mild diffuse low attenuation nodular thickening of both adrenal glands unchanged. Tiny nonobstructing LEFT renal calculi. BILATERAL renal cortical thinning. Deformity of the lateral margin of the LEFT kidney, new since previous exam likely representing a small subcapsular fluid collection 2.0 x 1.1 cm image 33. No hydronephrosis or hydroureter. Bladder poorly visualized due to beam hardening artifacts in pelvis, though bladder contains excreted contrast material. Prostate gland obscured. Stomach/Bowel: Normal appendix. Descending and sigmoid diverticulosis. Portions of pelvic bowel loops obscured by beam hardening artifacts. Stomach and bowel loops otherwise unremarkable. Vascular/Lymphatic: Atherosclerotic calcifications aorta and iliac arteries. Significant calcified plaque at the origins of the renal arteries bilaterally question renal artery stenosis. Additional significant plaque at the origins of the SMA and IMA. Coronary toe calcifications. Mitral  annular calcifications. No definite adenopathy. Reproductive: N/A Other: No free air free fluid. Musculoskeletal: Significant beam hardening artifacts in pelvis from BILATERAL hip prostheses. Osseous demineralization. Degenerative disc disease changes lumbar spine. No acute bony findings. IMPRESSION: RIGHT lower lobe neoplasm with extensive RIGHT hilar and infrahilar adenopathy as well as probable  lymphangitic tumor spread throughout RIGHT lung. LEFT lower lobe pulmonary nodule. No definite intra-abdominal or intrapelvic metastases. Cholelithiasis. Aortic atherosclerosis and coronary arterial calcifications with significant stenoses at the origins of the renal arteries and SMA. Limitations of pelvic imaging secondary to beam hardening artifacts from BILATERAL hip prostheses. Electronically Signed   By: Lavonia Dana M.D.   On: 11/20/2016 11:58   US Biopsy  Result Date: 11/21/2016 CLINICAL DATA:  Right lower lobe lung mass with metastatic lymphadenopathy to the mediastinum, right hilum and right supraclavicular region by prior imaging. EXAM: ULTRASOUND GUIDED CORE BIOPSY OF RIGHT SUPRACLAVICULAR LYMPH NODE MEDICATIONS: 0.5 mg IV Versed; 25 mcg IV Fentanyl Total Moderate Sedation Time: 17 minutes. The patient's level of consciousness and physiologic status were continuously monitored during the procedure by Radiology nursing. PROCEDURE: The procedure, risks, benefits, and alternatives were explained to the patient. Questions regarding the procedure were encouraged and answered. The patient understands and consents to the procedure. A time out was performed prior to initiating the procedure. The right neck was prepped with chlorhexidine in a sterile fashion, and a sterile drape was applied covering the operative field. A sterile gown and sterile gloves were used for the procedure. Local anesthesia was provided with 1% Lidocaine. Right supraclavicular/lower cervical lymph nodes were localized. Four 18 gauge core biopsy samples were obtained through a supraclavicular lymph node. COMPLICATIONS: None. FINDINGS: Three abnormal appearing right-sided supraclavicular lymph nodes are identified. The largest measures approximately 1.1 cm. Core biopsy was obtained through this lymph node. IMPRESSION: Ultrasound-guided core biopsy performed of an abnormal right supraclavicular lymph node. Electronically Signed   By: Aletta Edouard M.D.   On: 11/21/2016 17:07   Dg Chest Portable 1 View  Result Date: 11/19/2016 CLINICAL DATA:  Cough and shortness of breath for 2 weeks.  COPD. EXAM: PORTABLE CHEST 1 VIEW COMPARISON:  05/06/2016 FINDINGS: Right infrahilar mass like appearance with a density in this region measuring about 6.0 by 4.8 cm. Surrounding airspace opacities and coarse interstitial opacities primarily in the right lower lobe and right middle lobe. Faint interstitial accentuation at the left lung base. Severe emphysema. Tortuous thoracic aorta. Severe degenerative glenohumeral arthropathy bilaterally. IMPRESSION: 1. Right infrahilar masslike appearance suspicious for malignancy. Conceivably this could be due to more focal consolidation in the setting of background coarse interstitial accentuation and airspace opacities in the right lower lobe and right middle lobe, rather than necessarily being from malignancy. Chest CT (with contrast if feasible) is recommended for further characterization. 2. Severe emphysema. 3. Tortuous thoracic aorta. 4. Severe degenerative glenohumeral arthropathy bilaterally. Electronically Signed   By: Van Clines M.D.   On: 11/19/2016 10:45     Assessment and plan- Patient is a 76 y.o. male with a history of newly diagnosed stage IV adenocarcinoma of the lung cT4 (invading mediastinum)N3 (contralateral/supraclavicular adenopathy)M1b bone mets  1. I discussed the results of the CT scans, bone scan and  MRI of the brain as well as the pathology with the patient and his daughter in [place] bilaterally pulmonary involvement as well as bone metastases this is consistent with stage IV cancer. I explained to them that treatment at this time would be palliative and not curative to  shrink the tumor and improve his quality of life and longevity. Treatment could either be palliative chemotherapy or immunotherapy based on PDL 1 status which is currently pending. If PDL 1 is greater than 50% I  would favor first-line therapy with Keytruda 200 mg IV every 3 weeks until progression. If PDL 1 is less than 50% I would favor combination platinum-based chemotherapy with carboplatin and Alimta every 3 weeks for 6 cycles. I do not think that the patient is fit enough to receive combination chemotherapy immunotherapy at this time given his age and overall performance status. Patient will need port placement before beginning treatment and we will make arrangements for the same. I will see him back in 10 days' time and discuss the results of the PDL 1 testing and plan to start treatment on that day. I explained the risks and benefits of keytruda including all but not limited to autoimmune pneumonitis, hepatitis, colitis and need to monitor kidney functions. I explained the risks and benefits of chemotherapy including all but not limited to nausea, vomiting, low blood counts, fatigue and risk of infections. Patient understands and agrees to proceed. He will start taking folic acid 1 mg by mouth daily and if he gets [place] Alimta we will plan to give him B-12 injections every 9 weeks. We will also send foundation 1 testing for molecular markers including EGFR, ALK, ROS1 and BRAF testing  2. Patient also has bone metastases and would benefit from Zometa every 4 weeks. I explained the risks and benefits of Zometa including all but not limited to fatigue, leg swelling and hypocalcemia as well as osteonecrosis of the jaw. Patient understands and agrees to proceed as planned. He is edentulous and give his overall Ps, we will hold off on dental clearance for zometa. He does not have a dentist  3. Patient was noted to have 2 lesions in his frontal lobe which appeared to be benign however early metastases could not be ruled out. Patient is asymptomatic from that standpoint and we will repeat an MRI x-ray weeks from now.   Total face to face encounter time for this patient visit was 45 min. >50% of the time was  spent in  counseling and coordination of care.     Visit Diagnosis 1. Adenocarcinoma of right lung, stage 4 ([place])   2. Goals of care, counseling/discussion   3. Bone metastases ([place])      Dr. Randa Evens, MD, MPH [facility] Pager- [phone] 11/26/2016 1:02 PM

## 2016-11-26 NOTE — Progress Notes (Signed)
Pt here for f/u after hospitalization 11/18/16-2/9. Home health coming in. Today family car broke down and came to [place] w empty 02 tank. Given o2 at 4 liters. Initial o2 sat 64 . Repeat at 1458 was 90 % on RA

## 2016-11-27 ENCOUNTER — Emergency Department: Payer: Medicare Other

## 2016-11-27 ENCOUNTER — Other Ambulatory Visit: Payer: Self-pay | Admitting: Oncology

## 2016-11-27 ENCOUNTER — Inpatient Hospital Stay
Admission: EM | Admit: 2016-11-27 | Discharge: 2016-12-12 | DRG: 193 | Disposition: E | Payer: Medicare Other | Attending: Internal Medicine | Admitting: Internal Medicine

## 2016-11-27 ENCOUNTER — Encounter: Payer: Self-pay | Admitting: Emergency Medicine

## 2016-11-27 ENCOUNTER — Other Ambulatory Visit: Payer: Self-pay | Admitting: Pathology

## 2016-11-27 ENCOUNTER — Other Ambulatory Visit: Payer: Self-pay

## 2016-11-27 DIAGNOSIS — J432 Centrilobular emphysema: Secondary | ICD-10-CM | POA: Diagnosis present

## 2016-11-27 DIAGNOSIS — Y95 Nosocomial condition: Secondary | ICD-10-CM | POA: Diagnosis present

## 2016-11-27 DIAGNOSIS — C7951 Secondary malignant neoplasm of bone: Secondary | ICD-10-CM | POA: Diagnosis present

## 2016-11-27 DIAGNOSIS — F17210 Nicotine dependence, cigarettes, uncomplicated: Secondary | ICD-10-CM | POA: Diagnosis present

## 2016-11-27 DIAGNOSIS — R591 Generalized enlarged lymph nodes: Secondary | ICD-10-CM

## 2016-11-27 DIAGNOSIS — C3491 Malignant neoplasm of unspecified part of right bronchus or lung: Secondary | ICD-10-CM | POA: Diagnosis not present

## 2016-11-27 DIAGNOSIS — J189 Pneumonia, unspecified organism: Principal | ICD-10-CM | POA: Diagnosis present

## 2016-11-27 DIAGNOSIS — I10 Essential (primary) hypertension: Secondary | ICD-10-CM | POA: Diagnosis present

## 2016-11-27 DIAGNOSIS — J441 Chronic obstructive pulmonary disease with (acute) exacerbation: Secondary | ICD-10-CM | POA: Diagnosis present

## 2016-11-27 DIAGNOSIS — N179 Acute kidney failure, unspecified: Secondary | ICD-10-CM | POA: Diagnosis present

## 2016-11-27 DIAGNOSIS — Z96643 Presence of artificial hip joint, bilateral: Secondary | ICD-10-CM | POA: Diagnosis present

## 2016-11-27 DIAGNOSIS — J45901 Unspecified asthma with (acute) exacerbation: Secondary | ICD-10-CM | POA: Diagnosis not present

## 2016-11-27 DIAGNOSIS — D649 Anemia, unspecified: Secondary | ICD-10-CM | POA: Diagnosis present

## 2016-11-27 DIAGNOSIS — J9690 Respiratory failure, unspecified, unspecified whether with hypoxia or hypercapnia: Secondary | ICD-10-CM

## 2016-11-27 DIAGNOSIS — Z66 Do not resuscitate: Secondary | ICD-10-CM | POA: Diagnosis not present

## 2016-11-27 DIAGNOSIS — Z803 Family history of malignant neoplasm of breast: Secondary | ICD-10-CM | POA: Diagnosis not present

## 2016-11-27 DIAGNOSIS — C3431 Malignant neoplasm of lower lobe, right bronchus or lung: Secondary | ICD-10-CM | POA: Diagnosis not present

## 2016-11-27 DIAGNOSIS — J449 Chronic obstructive pulmonary disease, unspecified: Secondary | ICD-10-CM | POA: Diagnosis not present

## 2016-11-27 DIAGNOSIS — J9621 Acute and chronic respiratory failure with hypoxia: Secondary | ICD-10-CM | POA: Diagnosis not present

## 2016-11-27 DIAGNOSIS — R0602 Shortness of breath: Secondary | ICD-10-CM | POA: Diagnosis present

## 2016-11-27 DIAGNOSIS — C7931 Secondary malignant neoplasm of brain: Secondary | ICD-10-CM | POA: Diagnosis present

## 2016-11-27 DIAGNOSIS — Z515 Encounter for palliative care: Secondary | ICD-10-CM | POA: Diagnosis not present

## 2016-11-27 LAB — URINALYSIS, COMPLETE (UACMP) WITH MICROSCOPIC
Bilirubin Urine: NEGATIVE
Glucose, UA: NEGATIVE mg/dL
Ketones, ur: NEGATIVE mg/dL
Leukocytes, UA: NEGATIVE
NITRITE: NEGATIVE
PROTEIN: 30 mg/dL — AB
SPECIFIC GRAVITY, URINE: 1.018 (ref 1.005–1.030)
SQUAMOUS EPITHELIAL / LPF: NONE SEEN
pH: 5 (ref 5.0–8.0)

## 2016-11-27 LAB — CBC WITH DIFFERENTIAL/PLATELET
BASOS PCT: 0 %
Band Neutrophils: 6 %
Basophils Absolute: 0 10*3/uL (ref 0–0.1)
Blasts: 0 %
EOS PCT: 0 %
Eosinophils Absolute: 0 10*3/uL (ref 0–0.7)
HCT: 20.4 % — ABNORMAL LOW (ref 40.0–52.0)
Hemoglobin: 6.5 g/dL — ABNORMAL LOW (ref 13.0–18.0)
LYMPHS ABS: 2.5 10*3/uL (ref 1.0–3.6)
Lymphocytes Relative: 19 %
MCH: 28.4 pg (ref 26.0–34.0)
MCHC: 32.2 g/dL (ref 32.0–36.0)
MCV: 88.4 fL (ref 80.0–100.0)
MONO ABS: 0.1 10*3/uL — AB (ref 0.2–1.0)
MYELOCYTES: 2 %
Metamyelocytes Relative: 0 %
Monocytes Relative: 1 %
NEUTROS PCT: 72 %
NRBC: 10 /100{WBCs} — AB
Neutro Abs: 10.7 10*3/uL — ABNORMAL HIGH (ref 1.4–6.5)
Other: 0 %
PLATELETS: 197 10*3/uL (ref 150–440)
Promyelocytes Absolute: 0 %
RBC: 2.3 MIL/uL — ABNORMAL LOW (ref 4.40–5.90)
RDW: 15.2 % — ABNORMAL HIGH (ref 11.5–14.5)
WBC: 13.3 10*3/uL — ABNORMAL HIGH (ref 3.8–10.6)

## 2016-11-27 LAB — GLUCOSE, CAPILLARY: Glucose-Capillary: 132 mg/dL — ABNORMAL HIGH (ref 65–99)

## 2016-11-27 LAB — PREPARE RBC (CROSSMATCH)

## 2016-11-27 LAB — PROTIME-INR
INR: 1.47
PROTHROMBIN TIME: 18 s — AB (ref 11.4–15.2)

## 2016-11-27 LAB — COMPREHENSIVE METABOLIC PANEL
ALBUMIN: 2.6 g/dL — AB (ref 3.5–5.0)
ALK PHOS: 87 U/L (ref 38–126)
ALT: 26 U/L (ref 17–63)
ANION GAP: 11 (ref 5–15)
AST: 55 U/L — ABNORMAL HIGH (ref 15–41)
BILIRUBIN TOTAL: 0.3 mg/dL (ref 0.3–1.2)
BUN: 43 mg/dL — ABNORMAL HIGH (ref 6–20)
CALCIUM: 8.4 mg/dL — AB (ref 8.9–10.3)
CO2: 21 mmol/L — ABNORMAL LOW (ref 22–32)
Chloride: 109 mmol/L (ref 101–111)
Creatinine, Ser: 1.42 mg/dL — ABNORMAL HIGH (ref 0.61–1.24)
GFR calc non Af Amer: 47 mL/min — ABNORMAL LOW (ref >60)
GFR, EST AFRICAN AMERICAN: 55 mL/min — AB (ref >60)
GLUCOSE: 98 mg/dL (ref 65–99)
POTASSIUM: 3.9 mmol/L (ref 3.5–5.1)
SODIUM: 141 mmol/L (ref 135–145)
TOTAL PROTEIN: 6 g/dL — AB (ref 6.5–8.1)

## 2016-11-27 LAB — APTT: aPTT: 34 seconds (ref 24–36)

## 2016-11-27 LAB — LIPASE, BLOOD: LIPASE: 42 U/L (ref 11–51)

## 2016-11-27 LAB — LACTIC ACID, PLASMA
Lactic Acid, Venous: 3 mmol/L (ref 0.5–1.9)
Lactic Acid, Venous: 2.8 mmol/L (ref 0.5–1.9)

## 2016-11-27 LAB — ABO/RH: ABO/RH(D): A POS

## 2016-11-27 LAB — TROPONIN I: TROPONIN I: 0.03 ng/mL — AB (ref <0.03)

## 2016-11-27 MED ORDER — ALBUTEROL SULFATE (2.5 MG/3ML) 0.083% IN NEBU
5.0000 mg | INHALATION_SOLUTION | Freq: Once | RESPIRATORY_TRACT | Status: AC
  Administered 2016-11-27: 5 mg via RESPIRATORY_TRACT
  Filled 2016-11-27: qty 6

## 2016-11-27 MED ORDER — PIPERACILLIN-TAZOBACTAM 3.375 G IVPB: 3.3750 g | Freq: Three times a day (TID) | INTRAVENOUS | Status: DC

## 2016-11-27 MED ORDER — VANCOMYCIN HCL IN DEXTROSE 1-5 GM/200ML-% IV SOLN
1000.0000 mg | INTRAVENOUS | Status: DC
  Administered 2016-11-28: 1000 mg via INTRAVENOUS
  Filled 2016-11-27 (×2): qty 200

## 2016-11-27 MED ORDER — SODIUM CHLORIDE 0.9 % IV SOLN: 250.0000 mL | INTRAVENOUS | Status: DC | PRN

## 2016-11-27 MED ORDER — LORAZEPAM 2 MG/ML IJ SOLN
INTRAMUSCULAR | Status: AC
  Filled 2016-11-27: qty 1

## 2016-11-27 MED ORDER — IPRATROPIUM-ALBUTEROL 0.5-2.5 (3) MG/3ML IN SOLN: 3.0000 mL | RESPIRATORY_TRACT | Status: DC | PRN

## 2016-11-27 MED ORDER — SODIUM CHLORIDE 0.9 % IV BOLUS (SEPSIS)
1000.0000 mL | Freq: Once | INTRAVENOUS | Status: AC
Start: 2016-11-27 — End: 2016-11-27
  Administered 2016-11-27: 1000 mL via INTRAVENOUS

## 2016-11-27 MED ORDER — SODIUM CHLORIDE 0.9 % IV SOLN
INTRAVENOUS | Status: DC
  Administered 2016-11-27: 23:00:00 via INTRAVENOUS

## 2016-11-27 MED ORDER — METHYLPREDNISOLONE SODIUM SUCC 125 MG IJ SOLR
60.0000 mg | Freq: Four times a day (QID) | INTRAMUSCULAR | Status: DC
  Administered 2016-11-27 – 2016-11-28 (×2): 60 mg via INTRAVENOUS
  Filled 2016-11-27 (×2): qty 2

## 2016-11-27 MED ORDER — IPRATROPIUM-ALBUTEROL 0.5-2.5 (3) MG/3ML IN SOLN
3.0000 mL | Freq: Once | RESPIRATORY_TRACT | Status: AC
  Administered 2016-11-27: 3 mL via RESPIRATORY_TRACT
  Filled 2016-11-27: qty 3

## 2016-11-27 MED ORDER — SODIUM CHLORIDE 0.9 % IV BOLUS (SEPSIS)
1000.0000 mL | Freq: Once | INTRAVENOUS | Status: AC
  Administered 2016-11-27: 1000 mL via INTRAVENOUS

## 2016-11-27 MED ORDER — ACETAMINOPHEN 325 MG PO TABS
650.0000 mg | ORAL_TABLET | ORAL | Status: DC | PRN
  Administered 2016-11-29: 650 mg via ORAL
  Filled 2016-11-27: qty 2

## 2016-11-27 MED ORDER — DOCUSATE SODIUM 100 MG PO CAPS
100.0000 mg | ORAL_CAPSULE | Freq: Two times a day (BID) | ORAL | Status: DC
  Administered 2016-11-28 (×2): 100 mg via ORAL
  Filled 2016-11-27 (×2): qty 1

## 2016-11-27 MED ORDER — METHYLPREDNISOLONE SODIUM SUCC 125 MG IJ SOLR: 125.0000 mg | Freq: Once | INTRAMUSCULAR | Status: DC

## 2016-11-27 MED ORDER — LEVOFLOXACIN IN D5W 750 MG/150ML IV SOLN
750.0000 mg | Freq: Once | INTRAVENOUS | Status: AC
  Administered 2016-11-27: 750 mg via INTRAVENOUS
  Filled 2016-11-27: qty 150

## 2016-11-27 MED ORDER — SODIUM CHLORIDE 0.9 % IV SOLN: Freq: Once | INTRAVENOUS | Status: DC

## 2016-11-27 MED ORDER — VANCOMYCIN HCL IN DEXTROSE 1-5 GM/200ML-% IV SOLN
1000.0000 mg | Freq: Once | INTRAVENOUS | Status: AC
  Administered 2016-11-27: 1000 mg via INTRAVENOUS
  Filled 2016-11-27: qty 200

## 2016-11-27 MED ORDER — IOPAMIDOL (ISOVUE-370) INJECTION 76%
75.0000 mL | Freq: Once | INTRAVENOUS | Status: AC | PRN
  Administered 2016-11-27: 75 mL via INTRAVENOUS

## 2016-11-27 MED ORDER — MAGNESIUM SULFATE 2 GM/50ML IV SOLN
2.0000 g | Freq: Once | INTRAVENOUS | Status: AC
  Administered 2016-11-27: 2 g via INTRAVENOUS
  Filled 2016-11-27: qty 50

## 2016-11-27 MED ORDER — PIPERACILLIN-TAZOBACTAM 3.375 G IVPB 30 MIN
3.3750 g | Freq: Once | INTRAVENOUS | Status: AC
  Administered 2016-11-27: 3.375 g via INTRAVENOUS
  Filled 2016-11-27: qty 50

## 2016-11-27 MED ORDER — PIPERACILLIN-TAZOBACTAM 3.375 G IVPB
3.3750 g | Freq: Three times a day (TID) | INTRAVENOUS | Status: DC
  Administered 2016-11-28 – 2016-11-29 (×4): 3.375 g via INTRAVENOUS
  Filled 2016-11-27 (×4): qty 50

## 2016-11-27 MED ORDER — HEPARIN SODIUM (PORCINE) 5000 UNIT/ML IJ SOLN
5000.0000 [IU] | Freq: Three times a day (TID) | INTRAMUSCULAR | Status: DC
  Administered 2016-11-27 – 2016-11-28 (×2): 5000 [IU] via SUBCUTANEOUS
  Filled 2016-11-27 (×2): qty 1

## 2016-11-27 MED ORDER — QUETIAPINE FUMARATE 300 MG PO TABS
300.0000 mg | ORAL_TABLET | Freq: Every day | ORAL | Status: DC
  Filled 2016-11-27: qty 1

## 2016-11-27 NOTE — Progress Notes (Signed)
[place] Progress Note Patient Name: Jonathan Howell DOB: 10/23/1940 MRN: [id number]   Date of Service  12/05/2016  HPI/Events of Note  Admitted earlier this evening for COPD exacerbation, post obstructive pneumonia Resting comfortably on BIPAP  eICU Interventions  Continue to monitor     Intervention Category Evaluation Type: New Patient Evaluation  Simonne Maffucci 12/03/2016, 11:45 PM

## 2016-11-27 NOTE — Progress Notes (Addendum)
ANTIBIOTIC CONSULT NOTE - INITIAL  Pharmacy Consult for Vancomycin and Zosyn Indication: pneumonia  No Known Allergies  Patient Measurements: Height: '5\' 2"'$  (157.5 cm) Weight: 136 lb (61.7 kg) IBW/kg (Calculated) : 54.6 Adjusted Body Weight: 57.4 kg   Vital Signs: Temp: 98.5 F (36.9 C) (02/14 1724) Temp Source: Oral (02/14 1724) BP: 138/97 (02/14 2030) Pulse Rate: 122 (02/14 2030) Intake/Output from previous day: No intake/output data recorded. Intake/Output from this shift: No intake/output data recorded.  Labs:  Recent Labs  12/02/2016 1729  WBC 13.3*  HGB 6.5*  PLT 197  CREATININE 1.42*   Estimated Creatinine Clearance: 35.2 mL/min (by C-G formula based on SCr of 1.42 mg/dL (H)). No results for input(s): VANCOTROUGH, VANCOPEAK, VANCORANDOM, GENTTROUGH, GENTPEAK, GENTRANDOM, TOBRATROUGH, TOBRAPEAK, TOBRARND, AMIKACINPEAK, AMIKACINTROU, AMIKACIN in the last 72 hours.   Microbiology: Recent Results (from the past 720 hour(s))  Blood culture (routine x 2)     Status: None   Collection Time: 11/19/16 10:15 AM  Result Value Ref Range Status   Specimen Description BLOOD R FA  Final   Special Requests BOTTLES DRAWN AEROBIC AND ANAEROBIC BCAV  Final   Culture NO GROWTH 5 DAYS  Final   Report Status 11/24/2016 FINAL  Final  Blood culture (routine x 2)     Status: None   Collection Time: 11/19/16 10:25 AM  Result Value Ref Range Status   Specimen Description BLOOD L FA  Final   Special Requests   Final    BOTTLES DRAWN AEROBIC AND ANAEROBIC AER/BCHV ANA/BCAV   Culture NO GROWTH 5 DAYS  Final   Report Status 11/24/2016 FINAL  Final  MRSA PCR Screening     Status: None   Collection Time: 11/19/16  4:15 PM  Result Value Ref Range Status   MRSA by PCR NEGATIVE NEGATIVE Final    Comment:        The GeneXpert MRSA Assay (FDA approved for NASAL specimens only), is one component of a comprehensive MRSA colonization surveillance program. It is not intended to diagnose  MRSA infection nor to guide or monitor treatment for MRSA infections.     Medical History: Past Medical History:  Diagnosis Date  . Asthma   . COPD (chronic obstructive pulmonary disease) ([place])   . Emphysema of lung ([place])   . Hypertension   . Personal history of tobacco use, presenting hazards to health 07/14/2015    Medications:   (Not in a hospital admission) Assessment: CrCl = 35.2 ml/min ke = 0.034 hr-1 T1/12 = 20.4 hrs Vd = 43.2 L   Goal of Therapy:  Vancomycin trough level 15-20 mcg/ml  Plan:  1. Zosyn 3.375 gm IV Q8H EI 2. Vancomycin  Expected duration 7 days with resolution of temperature and/or normalization of WBC   Vancomycin 1 gm IV X 1 given on 2/14 @ 18:00. Vancomycin 1 gm IV Q24H ordered to start on 2/15 @ 0800, ~ 14 hrs after stacked dose. This pt will reach Css by 2/18 @ 18:00. Will draw 1st trough on 2/18 @ 0730, which will be close to Css.   Robbins,Jason D 11/15/2016,9:25 PM

## 2016-11-27 NOTE — ED Notes (Signed)
RT at bedside, pt on bi-pap

## 2016-11-27 NOTE — ED Notes (Signed)
0.'5mg'$  ativan given by Sherlene Shams

## 2016-11-27 NOTE — ED Provider Notes (Signed)
[facility] Emergency Department Provider Note  ____________________________________________  Time seen: Approximately 5:38 PM  I have reviewed the triage vital signs and the nursing notes.   HISTORY  Chief Complaint Shortness of Breath    HPI Jonathan Howell is a 76 y.o. male with stage IV lung cancer who was recently discharged from the hospital complains of sudden onset of shortness of breath that started today about noon. On his baseline 3 L nasal cannula at home, his oxygen saturation was 80% according to EMS. Patient denies chest pain.     Past Medical History:  Diagnosis Date  . Asthma   . COPD (chronic obstructive pulmonary disease) ([place])   . Emphysema of lung ([place])   . Hypertension   . Personal history of tobacco use, presenting hazards to health 07/14/2015     Patient Active Problem List   Diagnosis Date Noted  . Adenocarcinoma of lung, stage 4 ([place]) 11/26/2016  . Goals of care, counseling/discussion 11/26/2016  . Bone metastases ([place]) 11/26/2016  . Pneumonia 11/19/2016  . hypokalemia 05/09/2016  . Hypokalemia 05/09/2016  . Hyponatremia 05/09/2016  . Essential hypertension 05/09/2016  . Tobacco abuse counseling 05/09/2016  . 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 Acute delirium 05/08/2016  . Acute renal insufficiency 05/07/2016  . Personal history of tobacco use, presenting hazards to health 07/14/2015  . Atypical chest pain 08/07/2014     Past Surgical History:  Procedure Laterality Date  . bilateral hip replacment surg  2000,and 2015  . EXTRACORPOREAL SHOCK WAVE LITHOTRIPSY Left 05/16/2016   Procedure: EXTRACORPOREAL SHOCK WAVE LITHOTRIPSY (ESWL);  Surgeon: Hollice Espy, MD;  Location: [facility] ORS;  Service: Urology;  Laterality: Left;  . HEMORRHOID SURGERY       Prior to Admission medications   Medication Sig Start Date End Date Taking?  Authorizing Provider  amoxicillin-clavulanate (AUGMENTIN) 875-125 MG tablet Take 1 tablet by mouth every 12 (twelve) hours. 11/21/16  Yes Fritzi Mandes, MD  folic acid (FOLVITE) 1 MG tablet Take 1 tablet (1 mg total) by mouth daily. 11/26/16  Yes Sindy Guadeloupe, MD  predniSONE (DELTASONE) 10 MG tablet Take 5 tablets (50 mg total) by mouth daily with breakfast. Start 50 mg daily taper by 10 mg daily then stop 11/22/16  Yes Fritzi Mandes, MD  QUEtiapine (SEROQUEL) 300 MG tablet Take 300 mg by mouth at bedtime.   Yes Historical Provider, MD  docusate sodium (COLACE) 100 MG capsule Take 1 capsule (100 mg total) by mouth 2 (two) times daily. Patient not taking: Reported on 11/26/2016 05/16/16   Hollice Espy, MD  levalbuterol [facility]) 1.25 MG/0.5ML nebulizer solution Take 1.25 mg by nebulization every 6 (six) hours. Patient not taking: Reported on 11/26/2016 11/21/16   Fritzi Mandes, MD  mometasone-formoterol [facility]) 200-5 MCG/ACT AERO Inhale 2 puffs into the lungs 2 (two) times daily. Patient not taking: Reported on 11/26/2016 11/21/16   Fritzi Mandes, MD     Allergies Patient has no known allergies.   Family History  Problem Relation Age of Onset  . Breast cancer Mother   . Suicidality Father   . Psychiatric Illness Sister     Social History Social History  Substance Use Topics  . Smoking status: Former Smoker    Packs/day: 0.25    Years: 60.00    Quit date: 11/18/2016  . Smokeless tobacco: Never Used     Comment: 1 pack per day  . Alcohol use No  Comment: none x 30 yr per daughter kim    Review of Systems  Constitutional:   No fever or chills.  ENT:   No sore throat. No rhinorrhea. Cardiovascular:   No chest pain. Respiratory:   Positive shortness of breath without cough. Gastrointestinal:   Negative for abdominal pain, vomiting and diarrhea.  Genitourinary:   Negative for dysuria or difficulty urinating. Musculoskeletal:   Negative for focal pain or swelling Neurological:   Negative for  headaches 10-point ROS otherwise negative.  ____________________________________________   PHYSICAL EXAM:  VITAL SIGNS: ED Triage Vitals  Enc Vitals Group     BP 11/29/2016 1724 (!) 126/91     Pulse Rate 11/15/2016 1724 (!) 129     Resp 12/05/2016 1724 16     Temp 12/10/2016 1724 98.5 F (36.9 C)     Temp Source 11/19/2016 1724 Oral     SpO2 11/26/2016 1724 96 %     Weight 12/03/2016 1726 136 lb (61.7 kg)     Height 11/19/2016 1726 '5\' 2"'$  (1.575 m)     Head Circumference --      Peak Flow --      Pain Score 11/26/2016 1726 0     Pain Loc --      Pain Edu? --      Excl. in [place]? --     Vital signs reviewed, nursing assessments reviewed.   Constitutional:   Alert and oriented. Moderate respiratory distress. Eyes:   No scleral icterus. No conjunctival pallor. PERRL. EOMI.  No nystagmus. ENT   Head:   Normocephalic and atraumatic.   Nose:   No congestion/rhinnorhea. No septal hematoma   Mouth/Throat:   MMM, no pharyngeal erythema. No peritonsillar mass.    Neck:   No stridor. No SubQ emphysema. No meningismus. Hematological/Lymphatic/Immunilogical:   No cervical lymphadenopathy. Cardiovascular:   Tachycardia heart rate 1:30. Symmetric bilateral radial and DP pulses.  No murmurs.  Respiratory:   Tachypnea increased work of breathing. Diminished breath sounds diffusely. Diffuse expiratory wheezing.. Gastrointestinal:   Soft and nontender. Non distended. There is no CVA tenderness.  No rebound, rigidity, or guarding. Genitourinary:   deferred Musculoskeletal:   Nontender with normal range of motion in all extremities. No joint effusions.  No lower extremity tenderness.  No edema. Neurologic:   Normal speech and language.  CN 2-10 normal. Motor grossly intact. No gross focal neurologic deficits are appreciated.  Skin:    Skin is warm, dry and intact. No rash noted.  No petechiae, purpura, or bullae.  ____________________________________________    LABS (pertinent  positives/negatives) (all labs ordered are listed, but only abnormal results are displayed) Labs Reviewed  COMPREHENSIVE METABOLIC PANEL - Abnormal; Notable for the following:       Result Value   CO2 21 (*)    BUN 43 (*)    Creatinine, Ser 1.42 (*)    Calcium 8.4 (*)    Total Protein 6.0 (*)    Albumin 2.6 (*)    AST 55 (*)    GFR calc non Af Amer 47 (*)    GFR calc Af Amer 55 (*)    All other components within normal limits  LACTIC ACID, PLASMA - Abnormal; Notable for the following:    Lactic Acid, Venous 3.0 (*)    All other components within normal limits  CBC WITH DIFFERENTIAL/PLATELET - Abnormal; Notable for the following:    WBC 13.3 (*)    RBC 2.30 (*)    Hemoglobin 6.5 (*)  HCT 20.4 (*)    RDW 15.2 (*)    nRBC 10 (*)    Neutro Abs 10.7 (*)    Monocytes Absolute 0.1 (*)    All other components within normal limits  PROTIME-INR - Abnormal; Notable for the following:    Prothrombin Time 18.0 (*)    All other components within normal limits  TROPONIN I - Abnormal; Notable for the following:    Troponin I 0.03 (*)    All other components within normal limits  BLOOD GAS, VENOUS - Abnormal; Notable for the following:    Acid-base deficit 3.6 (*)    All other components within normal limits  CULTURE, BLOOD (ROUTINE X 2)  CULTURE, BLOOD (ROUTINE X 2)  CULTURE, EXPECTORATED SPUTUM-ASSESSMENT  URINE CULTURE  LIPASE, BLOOD  APTT  LACTIC ACID, PLASMA  URINALYSIS, COMPLETE (UACMP) WITH MICROSCOPIC  PATHOLOGIST SMEAR REVIEW   ____________________________________________   EKG  Interpreted by me Sinus tachycardia rate 126. Normal axis intervals QRS ST segments and T waves.  ____________________________________________    RADIOLOGY  Chest x-ray consistent with advanced emphysematous changes and infiltrative malignancy of the right lung. No acute findings. CT angiogram chest negative for PE. Shows progression of malignancy and postobstructive pneumonia on  underlying severe emphysema.  ____________________________________________   PROCEDURES Procedures CRITICAL CARE Performed by: Joni Fears, Remedios Mckone   Total critical care time: 35 minutes  Critical care time was exclusive of separately billable procedures and treating other patients.  Critical care was necessary to treat or prevent imminent or life-threatening deterioration.  Critical care was time spent personally by me on the following activities: development of treatment plan with patient and/or surrogate as well as nursing, discussions with consultants, evaluation of patient's response to treatment, examination of patient, obtaining history from patient or surrogate, ordering and performing treatments and interventions, ordering and review of laboratory studies, ordering and review of radiographic studies, pulse oximetry and re-evaluation of patient's condition.  ____________________________________________   INITIAL IMPRESSION / ASSESSMENT AND PLAN / ED COURSE  Pertinent labs & imaging results that were available during my care of the patient were reviewed by me and considered in my medical decision making (see chart for details).  Patient presents with shortness of breath tachycardia and severe hypoxia. COPD exacerbation versus heart failure versus PE versus pneumonia. Start vancomycin and Zosyn. Nebulizers and steroids. BiPAP. Plan for admission. If chest x-ray is unremarkable we'll need CT angiogram of the chest.     Clinical Course as of Nov 27 2034  Wed Nov 27, 2016  1843 Suddenly worsened dyspnea. Very anxious and removing NRB.  Ativan iv, bipap.  [PS]    Clinical Course User Index [PS] Carrie Mew, MD    ----------------------------------------- 7:40 PM on 11/16/2016 -----------------------------------------  Patient feeling much better on BiPAP. Work of breathing decreased. Oxygen saturation high 90s. Able to lie supine without worsened symptoms. CT angiogram  completed, consistent with recurrent postobstructive pneumonia of the right lung. Severe underlying emphysematous changes. Add Levaquin to the vancomycin and Zosyn. Continue bronchodilators. Plan to be admitted to ICU. Confirmed CODE STATUS with patient, full code. ____________________________________________   FINAL CLINICAL IMPRESSION(S) / ED DIAGNOSES  Final diagnoses:  COPD exacerbation ([place])  HCAP (healthcare-associated pneumonia)  Acute on chronic respiratory failure with hypoxia ([place])  Anemia, unspecified type      New Prescriptions   No medications on file     Portions of this note were generated with dragon dictation software. Dictation errors may occur despite best attempts at proofreading.  Carrie Mew, MD 11/16/2016 2036

## 2016-11-27 NOTE — ED Triage Notes (Addendum)
Pt to ED via EMS from home, pt recently diagnosed with stage 4 lung cancer on 11/18/16, was hospitalized with pneumonia also and released 2/9. Pt states he hasn't been feeling better, currently on amoxicillin. Per EMS pt wears 4L [place] chronic and was 80% on arrival, pt placed on non rebreather and is 97%. '125mg'$  solumedrol given en route. Pt denies pain, A&Ox4

## 2016-11-27 NOTE — H&P (Signed)
PULMONARY / CRITICAL CARE MEDICINE   Name: Jonathan Howell MRN: [id number] DOB: 1941/02/04    ADMISSION DATE:  12/05/2016 CONSULTATION DATE:  11/24/2016  REFERRING MD:  Dr. Brenton Grills  CHIEF COMPLAINT: Shortness of breath  HISTORY OF PRESENT ILLNESS:   Jonathan Howell is 76 year old male with past medical history significant for COPD, Tobacco Abuse, emphysema.  Patient was recently diagnosed with stage IV- Adenocarcinoma of the lung. Patient was recently admitted from 2/6 -2/8 with COPD exacerbation and Pneumonia.Patient presents to [facility] on  2/14 with worsening shortness of breath requiring BiPAP.PCCM team was called to admit the patient.  PAST MEDICAL HISTORY :  He  has a past medical history of Asthma; COPD (chronic obstructive pulmonary disease) ([place]); Emphysema of lung ([place]); Hypertension; and Personal history of tobacco use, presenting hazards to health (07/14/2015).  PAST SURGICAL HISTORY: He  has a past surgical history that includes Hemorrhoid surgery; Extracorporeal shock wave lithotripsy (Left, 05/16/2016); and bilateral hip replacment surg (2000,and 2015).  No Known Allergies  No current facility-administered medications on file prior to encounter.    Current Outpatient Prescriptions on File Prior to Encounter  Medication Sig  . amoxicillin-clavulanate (AUGMENTIN) 875-125 MG tablet Take 1 tablet by mouth every 12 (twelve) hours.  . folic acid (FOLVITE) 1 MG tablet Take 1 tablet (1 mg total) by mouth daily.  . predniSONE (DELTASONE) 10 MG tablet Take 5 tablets (50 mg total) by mouth daily with breakfast. Start 50 mg daily taper by 10 mg daily then stop  . QUEtiapine (SEROQUEL) 300 MG tablet Take 300 mg by mouth at bedtime.  . docusate sodium (COLACE) 100 MG capsule Take 1 capsule (100 mg total) by mouth 2 (two) times daily. (Patient not taking: Reported on 11/26/2016)  . levalbuterol (XOPENEX) 1.25 MG/0.5ML nebulizer solution Take 1.25 mg by nebulization every 6 (six) hours.  (Patient not taking: Reported on 11/26/2016)  . mometasone-formoterol (DULERA) 200-5 MCG/ACT AERO Inhale 2 puffs into the lungs 2 (two) times daily. (Patient not taking: Reported on 11/26/2016)    FAMILY HISTORY:  His indicated that his mother is deceased. He indicated that his father is deceased. He indicated that his sister is alive. He indicated that the status of his brother is unknown.    SOCIAL HISTORY: He  reports that he quit smoking 9 days ago. He has a 15.00 pack-year smoking history. He has never used smokeless tobacco. He reports that he does not drink alcohol or use drugs.  REVIEW OF SYSTEMS:   Review of Systems  Constitutional: Positive for malaise/fatigue. Negative for diaphoresis and weight loss.  HENT: Negative for congestion, ear discharge and ear pain.   Eyes: Negative for pain and discharge.  Respiratory: Positive for shortness of breath and wheezing. Negative for cough, hemoptysis and stridor.   Cardiovascular: Negative for claudication and leg swelling.  Gastrointestinal: Negative for abdominal pain, nausea and vomiting.  Genitourinary: Negative for frequency and hematuria.  Musculoskeletal: Negative for back pain and joint pain.  Neurological: Negative for tingling, tremors, sensory change and weakness.  Endo/Heme/Allergies: Negative for environmental allergies.  Psychiatric/Behavioral: Negative for hallucinations, substance abuse and suicidal ideas. The patient is not nervous/anxious.      SUBJECTIVE:  Patient states that his breathing has improved with BiPAP  VITAL SIGNS: BP (!) 138/97   Pulse (!) 122   Temp 98.5 F (36.9 C) (Oral)   Resp (!) 21   Ht '5\' 2"'$  (1.575 m)   Wt 61.7 kg (136 lb)   SpO2 100%  BMI 24.87 kg/m   HEMODYNAMICS:    VENTILATOR SETTINGS: FiO2 (%):  [50 %] 50 %  INTAKE / OUTPUT: No intake/output data recorded.  PHYSICAL EXAMINATION: General:  [place], On BiPAP Neuro:  Awake, alert, oriented, no focal  deficits HEENT:  AT,[place],PERRLA,No jvd Cardiovascular: S1S2,Regular, No MRG Lungs:  Wheezes throughout, no crackles, rhonchi noted Abdomen:  Distended, soft, +bs Musculoskeletal:  Active ROM, no deformity noted Skin:  Warm, dry and intact  LABS:  BMET  Recent Labs Lab 12/02/2016 1729  NA 141  K 3.9  CL 109  CO2 21*  BUN 43*  CREATININE 1.42*  GLUCOSE 98    Electrolytes  Recent Labs Lab 11/30/2016 1729  CALCIUM 8.4*    CBC  Recent Labs Lab 12/05/2016 1729  WBC 13.3*  HGB 6.5*  HCT 20.4*  PLT 197    Coag's  Recent Labs Lab 12/09/2016 1729  APTT 34  INR 1.47    Sepsis Markers  Recent Labs Lab 11/26/2016 1730  LATICACIDVEN 3.0*    ABG  Recent Labs Lab 11/22/16 1200  PHART 7.41  PCO2ART 36  PO2ART 76*    Liver Enzymes  Recent Labs Lab 12/06/2016 1729  AST 55*  ALT 26  ALKPHOS 87  BILITOT 0.3  ALBUMIN 2.6*    Cardiac Enzymes  Recent Labs Lab 11/21/2016 1729  TROPONINI 0.03*    Glucose No results for input(s): GLUCAP in the last 168 hours.  Imaging Ct Angio Chest Pe W And/or Wo Contrast  Result Date: 11/24/2016 CLINICAL DATA:  Stage IV lung cancer, hospitalized with pneumonia and recently released. Dyspnea, tachycardia and hypoxia. EXAM: CT ANGIOGRAPHY CHEST WITH CONTRAST TECHNIQUE: Multidetector CT imaging of the chest was performed using the standard protocol during bolus administration of intravenous contrast. Multiplanar CT image reconstructions and MIPs were obtained to evaluate the vascular anatomy. CONTRAST:  75 cc Isovue 370 IV COMPARISON:  Chest CT from 11/19/2016 FINDINGS: Cardiovascular: Normal cardiac chamber size. No pericardial effusion. Scattered aortic atherosclerosis without aneurysm nor dissection. Advanced three-vessel coronary arteriosclerosis. Mediastinum/Nodes: Stable mediastinal and hilar lymphadenopathy: 14 mm short axis pretracheal lymph node, image 38/series 4, unchanged. 16 mm short axis subcarinal lymph node, image  50/series 4, unchanged. 4 x 2.1 cm right hilar lymphadenopathy, image 46/series 4, no significant change. 14 mm left hilar lymph nodes, image 56/series 4, slightly increased from 11 mm. Lungs/Pleura: Once again there is extensive soft tissue masslike abnormality surrounding the right middle and lower lobe pulmonary arteries, main pulmonary artery, and bronchi to the right middle and lower lobes, more prominent in appearance than on prior exam especially port abuts the right inferior pulmonary vein and right heart border measuring 4.1 x 2.9 cm, image 59/series 4, previously estimated at 2.5 cm. No change with reference to right lower lobe masslike abnormality previously estimated at 5 x 3.5 cm, currently 4.8 x 3.7 cm. Postobstructive pulmonary consolidations have slightly increased in appearance to the right lower lobe and medial right middle lobe. Since prior, slight increase in small right effusion. Right middle, lower lobe and to a lesser degree right upper lobe interstitial thickening is identified, slightly more prominent than on prior and may reflect lymphangitic spread of tumor. Stable multiple subpleural nodules scattered within the right lung and left lung base consistent with metastatic disease. These range in size from 5 mm through 8 mm. Centrilobular emphysema. Upper Abdomen: Stable thickening of both adrenal glands. No obvious hepatic metastatic disease. Musculoskeletal: No apparent lytic or blastic disease to suggest osseous metastasis. Review  of the MIP images confirms the above findings. IMPRESSION: 1. Slight increase in small right pleural effusion with further thickening coarsening of interstitium suggestive of lymphangitic spread of tumor. The primary mass in the right lower lobe is not significantly changed however masslike lymphadenopathy about the hila are slightly increased in size since prior. 2. Some progression of postobstructive pulmonary consolidation and pneumonia since prior exam to the  right middle and lower lobes. 3. Metastatic subcentimeter pulmonary nodules bilaterally without significant change. 4. COPD. Electronically Signed   By: Ashley Royalty M.D.   On: 11/22/2016 20:04   Dg Chest Portable 1 View  Result Date: 12/11/2016 CLINICAL DATA:  Worsening shortness of breath and difficulty breathing today. History of COPD. Stage IV lung cancer. EXAM: PORTABLE CHEST 1 VIEW COMPARISON:  11/19/2016.  05/06/2016. FINDINGS: Advanced pattern of widespread emphysema. Extensive tumor in the right lower lobe and hilar region with lymphangitic infiltration throughout most of the right lung. This appears quite similar allowing for technical differences. There could be coexistent pulmonary inflammation. Fibrotic pattern at the left base as seen previously. IMPRESSION: No radiographic change since 11/19/2016. Advanced tumor in the right lower lobe and hilar region with lymphangitic infiltration of the right lung. Advanced background emphysema. There could be coexistent inflammatory change, but that is not specifically delineated. Electronically Signed   By: Nelson Chimes M.D.   On: 11/23/2016 17:53     STUDIES:  2/14 CTPE>>Slight increase in small right pleural effusion with furtherthickening coarsening of interstitium suggestive of lymphangiticspread of tumor. The primary mass in the right lower lobe is not significantly changed however masslike lymphadenopathy about thehila are slightly increased in size since prior.. Some progression of postobstructive pulmonary consolidation and pneumonia since prior exam to the right middle and lower lobes.. Metastatic subcentimeter pulmonary nodules bilaterally withoutsignificant change. COPD.  2/6>>2. CT thorax on 11/19/16 showed: 1. 5 cm right lower lobe lung mass with confluent right hilar and infrahilar tumor and ipsilateral and contralateral mediastinal lymphadenopathy. Obstructive pneumonia and probable interstitial spread of tumor. Pulmonary metastatic  disease.Marland Kitchen No obvious upper abdominal metastatic disease or osseousmetastatic disease. Severe underlying emphysema. Bilateral adrenal gland nodules, unchanged since 2016 and likely adenomas  CULTURES: 2/14 BC>> 2/14 UC>>  ANTIBIOTICS: 2/14 Vancomycin>> 2/14 Zosyn>>  SIGNIFICANT EVENTS: 2/14 Patient admitted to the ICU  On BiPAP with COPD Exacerbation and adenocarcinoma of the right lung.  LINES/TUBES: None  DISCUSSION: 76 year old male with PMH of COPD and recently diagnosed stage IV Lung Cancer.Admitted with likely progression of tumor, post obstructive PNA and AECOPD.  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 AECOPD Post obstructive PNA Tobacco abuse- quit 2/5 Small Right Pleural effusion P:   Continue BiPAP, wean as tolerated Vancomycin/zosyn Continue Methylprednisone Bronchodilators Will be followed by Oncologist as outpatient for treatment options for lung Ca  CARDIOVASCULAR A:  Hx Of Hypertension P:  Continuous Telemetry  RENAL A:   Acute on chronic Kidney Injury P:   Replace electrolytes per usual guidelines Follow BMET Strict I/O   GASTROINTESTINAL A:   No active issues P:   NPO   HEMATOLOGIC A:    Anemia  P:  Heparin for DVT prophylaxis Will transfuse 1 unit of PRBC'S F/u H/H  INFECTIOUS A:   Post obstructive PNA likely due to progression of the tumor Leukocytosis P:   Monitor fever ,WBC Antibiotics as above Follow cultures   ENDOCRINE A:   No active issues P:   Blood  glucose checks intermittently with BMP  NEUROLOGIC A:   No active issues P:   Provide supportive care  FAMILY  - Updates: Code status has been discussed with the patient and his family member.  Patient would like to be FULL CODE at this time.     Bincy Varughese,AG-ACNP Pulmonary and [place]   11/28/2016, 9:16 PM  PCCM ATTENDING ATTESTATION:  I have evaluated patient  with the APP Varughese, reviewed database in its entirety and discussed care plan in detail. In addition, this patient was discussed on multidisciplinary rounds.   Important exam findings: Cognition intact WOB elevated when off BiPAP Diffuse wheezes RLL crackles Tachy, regular, no M No LE edema  Major problems addressed by PCCM team: Acute on chronic hypoxemic respiratory failure Severe emphysema Recent diagnosis of stage IV lung Ca RLL volume loss/consolidation - doubt this is infectious but possible post obst PNA Guarded prognosis  PLAN/REC: As above  Bronchodilator regimen clarified. Add nebulized steroids Change BiPAP to PRN Oncology and Palliative Care Consults requested    Merton Border, MD PCCM service Mobile [phone] Pager [phone] 11/28/2016 [phone]

## 2016-11-27 NOTE — ED Notes (Signed)
Patient transported to CT with RT and RN

## 2016-11-27 NOTE — ED Notes (Signed)
Pt sitting on side of bed, states he feels like he is suffocating. MD called to bedside, pt diaphoretic, 02 sats 70s. RT called at this time. RN holding non rebreather to pt face.

## 2016-11-28 ENCOUNTER — Ambulatory Visit: Payer: Medicare Other

## 2016-11-28 DIAGNOSIS — C7951 Secondary malignant neoplasm of bone: Secondary | ICD-10-CM

## 2016-11-28 DIAGNOSIS — J189 Pneumonia, unspecified organism: Principal | ICD-10-CM

## 2016-11-28 DIAGNOSIS — I10 Essential (primary) hypertension: Secondary | ICD-10-CM

## 2016-11-28 DIAGNOSIS — Z79899 Other long term (current) drug therapy: Secondary | ICD-10-CM

## 2016-11-28 DIAGNOSIS — Z87891 Personal history of nicotine dependence: Secondary | ICD-10-CM

## 2016-11-28 DIAGNOSIS — J449 Chronic obstructive pulmonary disease, unspecified: Secondary | ICD-10-CM

## 2016-11-28 DIAGNOSIS — J45909 Unspecified asthma, uncomplicated: Secondary | ICD-10-CM

## 2016-11-28 DIAGNOSIS — C3431 Malignant neoplasm of lower lobe, right bronchus or lung: Secondary | ICD-10-CM

## 2016-11-28 LAB — BASIC METABOLIC PANEL
Anion gap: 10 (ref 5–15)
BUN: 37 mg/dL — AB (ref 6–20)
CALCIUM: 7.8 mg/dL — AB (ref 8.9–10.3)
CO2: 20 mmol/L — AB (ref 22–32)
CREATININE: 1.35 mg/dL — AB (ref 0.61–1.24)
Chloride: 113 mmol/L — ABNORMAL HIGH (ref 101–111)
GFR calc non Af Amer: 50 mL/min — ABNORMAL LOW (ref >60)
GFR, EST AFRICAN AMERICAN: 58 mL/min — AB (ref >60)
Glucose, Bld: 122 mg/dL — ABNORMAL HIGH (ref 65–99)
Potassium: 3.9 mmol/L (ref 3.5–5.1)
SODIUM: 143 mmol/L (ref 135–145)

## 2016-11-28 LAB — CBC
HEMATOCRIT: 22.6 % — AB (ref 40.0–52.0)
Hemoglobin: 7.5 g/dL — ABNORMAL LOW (ref 13.0–18.0)
MCH: 27.7 pg (ref 26.0–34.0)
MCHC: 33.3 g/dL (ref 32.0–36.0)
MCV: 83.1 fL (ref 80.0–100.0)
Platelets: 171 10*3/uL (ref 150–440)
RBC: 2.72 MIL/uL — ABNORMAL LOW (ref 4.40–5.90)
RDW: 18.6 % — AB (ref 11.5–14.5)
WBC: 12.3 10*3/uL — ABNORMAL HIGH (ref 3.8–10.6)

## 2016-11-28 LAB — GLUCOSE, CAPILLARY
GLUCOSE-CAPILLARY: 129 mg/dL — AB (ref 65–99)
GLUCOSE-CAPILLARY: 117 mg/dL — AB (ref 65–99)
GLUCOSE-CAPILLARY: 99 mg/dL (ref 65–99)

## 2016-11-28 LAB — PATHOLOGIST SMEAR REVIEW

## 2016-11-28 LAB — MAGNESIUM: Magnesium: 2.5 mg/dL — ABNORMAL HIGH (ref 1.7–2.4)

## 2016-11-28 LAB — PHOSPHORUS: PHOSPHORUS: 5 mg/dL — AB (ref 2.5–4.6)

## 2016-11-28 MED ORDER — ALBUTEROL SULFATE (2.5 MG/3ML) 0.083% IN NEBU: 2.5000 mg | INHALATION_SOLUTION | RESPIRATORY_TRACT | Status: DC | PRN

## 2016-11-28 MED ORDER — BUDESONIDE 0.25 MG/2ML IN SUSP
0.2500 mg | Freq: Four times a day (QID) | RESPIRATORY_TRACT | Status: DC
  Administered 2016-11-28 – 2016-11-29 (×4): 0.25 mg via RESPIRATORY_TRACT
  Filled 2016-11-28 (×6): qty 2

## 2016-11-28 MED ORDER — LACTATED RINGERS IV SOLN
INTRAVENOUS | Status: DC
  Administered 2016-11-28 – 2016-11-29 (×2): via INTRAVENOUS

## 2016-11-28 MED ORDER — TRAZODONE HCL 50 MG PO TABS
150.0000 mg | ORAL_TABLET | Freq: Every day | ORAL | Status: DC
  Administered 2016-11-28: 150 mg via ORAL
  Filled 2016-11-28 (×3): qty 1

## 2016-11-28 MED ORDER — ORAL CARE MOUTH RINSE
15.0000 mL | Freq: Two times a day (BID) | OROMUCOSAL | Status: DC
  Administered 2016-11-28 – 2016-12-01 (×5): 15 mL via OROMUCOSAL

## 2016-11-28 MED ORDER — ALPRAZOLAM 0.5 MG PO TABS
0.5000 mg | ORAL_TABLET | Freq: Three times a day (TID) | ORAL | Status: DC | PRN
  Administered 2016-11-29: 0.5 mg via ORAL
  Filled 2016-11-28 (×2): qty 1

## 2016-11-28 MED ORDER — IPRATROPIUM-ALBUTEROL 0.5-2.5 (3) MG/3ML IN SOLN
3.0000 mL | Freq: Four times a day (QID) | RESPIRATORY_TRACT | Status: DC
  Administered 2016-11-28 – 2016-11-29 (×4): 3 mL via RESPIRATORY_TRACT
  Filled 2016-11-28 (×5): qty 3

## 2016-11-28 MED ORDER — BUDESONIDE 0.25 MG/2ML IN SUSP: 0.2500 mg | Freq: Four times a day (QID) | RESPIRATORY_TRACT | Status: DC

## 2016-11-28 MED ORDER — METHYLPREDNISOLONE SODIUM SUCC 125 MG IJ SOLR
80.0000 mg | Freq: Three times a day (TID) | INTRAMUSCULAR | Status: DC
  Administered 2016-11-28 – 2016-11-29 (×3): 80 mg via INTRAVENOUS
  Filled 2016-11-28 (×4): qty 2

## 2016-11-28 MED ORDER — ENOXAPARIN SODIUM 40 MG/0.4ML ~~LOC~~ SOLN
40.0000 mg | SUBCUTANEOUS | Status: DC
  Administered 2016-11-28: 40 mg via SUBCUTANEOUS
  Filled 2016-11-28 (×2): qty 0.4

## 2016-11-28 NOTE — Consult Note (Addendum)
 Hematology/Oncology Consult note [facility] [phone] [phone]  Patient Care Team: William M Selvidge, MD as PCP - General (Family Medicine)   Name of the patient: Jonathan Howell  [id number]  06/09/1941    Reason for consult - metastatic NSCLC    Requesting physician- Dr. Simonds  Date of visit: 11/28/2016    History of presenting illness- 1. Patient is a 76 yr old who has a h/o COPD not on home oxygen, HTN who presented to the hospital with worsening SOB that has been ongoing for 3 weeks ago. CXR showed possible pneumonia.   2. CT thorax on 11/19/16 showed: 1. 5 cm right lower lobe lung mass with confluent right hilar and infrahilar tumor and ipsilateral and contralateral mediastinal lymphadenopathy. Obstructive pneumonia and probable interstitial spread of tumor. Pulmonary metastatic disease.. No obvious upper abdominal metastatic disease or osseousmetastatic disease. Severe underlying emphysema. Bilateral adrenal gland nodules, unchanged since 2016 and likely adenomas  3. CT abdomen did not show any definite intra-abdominal or intrapelvic metastases  4. Bone scan showed diffuse osseous metastatic disease. Scattered areas of increased activity are identified in the rib cage bilaterally both anteriorly and posteriorly as well as within the iliac bones adjacent to the sacroiliac joint as well as the left ischial tuberosity. Changes in the sternum and to a lesser degree in the thoracic and lumbar spine are noted. These areas are consistent with metastatic disease.   5. MRI Brain: 1. Difficult to exclude two very early brain metastases in both superior frontal lobes, but favor instead benign vascular related enhancement. Repeat Brain MRI without and with contrast recommended in 6-8 weeks (ideally an SRS protocol study performed on a 3 Tesla MRI unit). 2. Otherwise no metastatic disease or acute intracranial abnormality. 3. Advanced  cervical spine degeneration.   6. 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 Molecular testing is pending  7. Patient was seen by me in my office on 11/26/16. I discussed biopsy results and plan was to get port placement and consider chemotherapy versus immunotherapy based on PDL1 results.  8. Patient is now re admitted after recent discharge on 2/8 for progressive hypoxic respiratory failure requiring BiPAP  Patient appears to be in respiratory distress and he has not been able to be weaned down below NRB. Patients daughter is at the bedside  ECOG PS- 2-3  Pain scale- 0   Review of systems- Review of Systems  Constitutional: Positive for malaise/fatigue. Negative for chills, fever and weight loss.  HENT: Negative for congestion, ear discharge and nosebleeds.   Eyes: Negative for blurred vision.  Respiratory: Positive for shortness of breath. Negative for cough, hemoptysis, sputum production and wheezing.   Cardiovascular: Negative for chest pain, palpitations, orthopnea and claudication.  Gastrointestinal: Negative for abdominal pain, blood in stool, constipation, diarrhea, heartburn, melena, nausea and vomiting.  Genitourinary: Negative for dysuria, flank pain, frequency, hematuria and urgency.  Musculoskeletal: Negative for back pain, joint pain and myalgias.  Skin: Negative for rash.  Neurological: Negative for dizziness, tingling, focal weakness, seizures, weakness and headaches.  Endo/Heme/Allergies: Does not bruise/bleed easily.  Psychiatric/Behavioral: Negative for depression and suicidal ideas. The patient does not have insomnia.     No Known Allergies  Patient Active Problem List   Diagnosis Date Noted  . COPD exacerbation (HCC) 11/18/2016  . Acute on chronic respiratory failure with hypoxia (HCC)   . Adenocarcinoma of right lung (HCC)   . Adenocarcinoma of lung, stage 4 (  [place]) 11/26/2016  . Goals of  care, counseling/discussion 11/26/2016  . Bone metastases ([place]) 11/26/2016  . Pneumonia 11/19/2016  . hypokalemia 05/09/2016  . Hypokalemia 05/09/2016  . Hyponatremia 05/09/2016  . Essential hypertension 05/09/2016  . Tobacco abuse counseling 05/09/2016  . 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 Acute delirium 05/08/2016  . Acute renal insufficiency 05/07/2016  . Personal history of tobacco use, presenting hazards to health 07/14/2015  . Atypical chest pain 08/07/2014     Past Medical History:  Diagnosis Date  . Asthma   . COPD (chronic obstructive pulmonary disease) ([place])   . Emphysema of lung ([place])   . Hypertension   . Personal history of tobacco use, presenting hazards to health 07/14/2015     Past Surgical History:  Procedure Laterality Date  . bilateral hip replacment surg  2000,and 2015  . EXTRACORPOREAL SHOCK WAVE LITHOTRIPSY Left 05/16/2016   Procedure: EXTRACORPOREAL SHOCK WAVE LITHOTRIPSY (ESWL);  Surgeon: Hollice Espy, MD;  Location: [facility] ORS;  Service: Urology;  Laterality: Left;  . HEMORRHOID SURGERY      Social History   Social History  . Marital status: Widowed    Spouse name: N/A  . Number of children: N/A  . Years of education: N/A   Occupational History  . Not on file.   Social History Main Topics  . Smoking status: Former Smoker    Packs/day: 0.25    Years: 60.00    Quit date: 11/18/2016  . Smokeless tobacco: Never Used     Comment: 1 pack per day  . Alcohol use No     Comment: none x 30 yr per daughter kim  . Drug use: No  . Sexual activity: Yes    Birth control/ protection: None   Other Topics Concern  . Not on file   Social History Narrative   Lives with family. Independent at baseline.     Family History  Problem Relation Age of Onset  . Breast cancer Mother   . Suicidality Father   . Psychiatric Illness Sister      Current Facility-Administered  Medications:  .  0.9 %  sodium chloride infusion, 250 mL, Intravenous, PRN, Bincy S Varughese, NP .  0.9 %  sodium chloride infusion, , Intravenous, Once, Bincy S Varughese, NP .  acetaminophen (TYLENOL) tablet 650 mg, 650 mg, Oral, Q4H PRN, Bincy S Varughese, NP .  albuterol (PROVENTIL) (2.5 MG/3ML) 0.083% nebulizer solution 2.5 mg, 2.5 mg, Nebulization, Q3H PRN, Wilhelmina Mcardle, MD .  ALPRAZolam Duanne Moron) tablet 0.5 mg, 0.5 mg, Oral, TID PRN, Wilhelmina Mcardle, MD .  budesonide (PULMICORT) nebulizer solution 0.25 mg, 0.25 mg, Nebulization, Q6H, Wilhelmina Mcardle, MD, 0.25 mg at 11/28/16 1346 .  docusate sodium (COLACE) capsule 100 mg, 100 mg, Oral, BID, Bincy S Varughese, NP, 100 mg at 11/28/16 1000 .  enoxaparin (LOVENOX) injection 40 mg, 40 mg, Subcutaneous, Q24H, Wilhelmina Mcardle, MD, 40 mg at 11/28/16 0900 .  ipratropium-albuterol (DUONEB) 0.5-2.5 (3) MG/3ML nebulizer solution 3 mL, 3 mL, Nebulization, Q6H, Wilhelmina Mcardle, MD, 3 mL at 11/28/16 1346 .  lactated ringers infusion, , Intravenous, Continuous, Wilhelmina Mcardle, MD, Last Rate: 50 mL/hr at 11/28/16 0900 .  MEDLINE mouth rinse, 15 mL, Mouth Rinse, BID, Wilhelmina Mcardle, MD .  methylPREDNISolone sodium succinate (SOLU-MEDROL) 125 mg/2 mL injection 80 mg, 80 mg, Intravenous, Q8H, Wilhelmina Mcardle, MD .  piperacillin-tazobactam (ZOSYN) IVPB 3.375  g, 3.375 g, Intravenous, Q8H, Bincy S Varughese, NP, 3.375 g at 11/28/16 1000 .  traZODone (DESYREL) tablet 150 mg, 150 mg, Oral, QHS, David B Simonds, MD .  vancomycin (VANCOCIN) IVPB 1000 mg/200 mL premix, 1,000 mg, Intravenous, Q24H, David B Simonds, MD, 1,000 mg at 11/28/16 0800   Physical exam:  Vitals:   11/28/16 0856 11/28/16 0900 11/28/16 0912 11/28/16 1000  BP:  (!) 187/112  (!) 159/103  Pulse:  (!) 121  (!) 110  Resp:  18  (!) 23  Temp:   98.1 F (36.7 C)   TempSrc:   Axillary   SpO2: 99% 100%  100%  Weight:      Height:       Physical Exam  Constitutional: He is well-developed,  well-nourished, and in no distress.  HENT:  Head: Normocephalic and atraumatic.  Eyes: EOM are normal. Pupils are equal, round, and reactive to light.  Neck: Normal range of motion.  Cardiovascular: Regular rhythm and normal heart sounds.   tachycardic  Pulmonary/Chest:  Increased effort of breathing. Breath sounds decreased b/l  Abdominal: Soft. Bowel sounds are normal.  Neurological: He is alert.  Oriented to self  Skin: Skin is warm and dry.       CMP Latest Ref Rng & Units 11/28/2016  Glucose 65 - 99 mg/dL 122(H)  BUN 6 - 20 mg/dL 37(H)  Creatinine 0.61 - 1.24 mg/dL 1.35(H)  Sodium 135 - 145 mmol/L 143  Potassium 3.5 - 5.1 mmol/L 3.9  Chloride 101 - 111 mmol/L 113(H)  CO2 22 - 32 mmol/L 20(L)  Calcium 8.9 - 10.3 mg/dL 7.8(L)  Total Protein 6.5 - 8.1 g/dL -  Total Bilirubin 0.3 - 1.2 mg/dL -  Alkaline Phos 38 - 126 U/L -  AST 15 - 41 U/L -  ALT 17 - 63 U/L -   CBC Latest Ref Rng & Units 11/28/2016  WBC 3.8 - 10.6 K/uL 12.3(H)  Hemoglobin 13.0 - 18.0 g/dL 7.5(L)  Hematocrit 40.0 - 52.0 % 22.6(L)  Platelets 150 - 440 K/uL 171    @IMAGES@  Ct Chest W Contrast  Result Date: 11/19/2016 CLINICAL DATA:  Cough and shortness of breath for 3 weeks. EXAM: CT CHEST WITH CONTRAST TECHNIQUE: Multidetector CT imaging of the chest was performed during intravenous contrast administration. CONTRAST:  60mL ISOVUE-300 IOPAMIDOL (ISOVUE-300) INJECTION 61% COMPARISON:  Chest x-ray 11/19/2016 and chest CT 02/28/2015 FINDINGS: Chest wall: No chest wall mass or axillary adenopathy. 8.5 mm supraclavicular node noted on the right side on image 4. The thyroid gland appears normal. Cardiovascular: The heart is normal in size. No pericardial effusion. Scattered aortic calcifications but no aneurysm or dissection. Advanced three-vessel coronary artery calcifications. Mediastinum/Nodes: Extensive mediastinal and right hilar/ infrahilar lymphadenopathy. 14 mm pretracheal node on image number 53. 3.7 x 2.3  cm right hilar adenopathy. 17 mm subcarinal lymph node. 11 mm left infrahilar lymph node. Extensive tumor surrounding the right middle lobe pulmonary artery, main pulmonary artery and right lower lobe pulmonary artery and right lower lobe and middle lobe bronchi. The esophagus is grossly normal. Lungs/Pleura: 5 x 3.5 cm right lower lobe lung mass with adjacent confirm 1 infrahilar and hilar tumor. There is also obstructive pneumonitis and probable interstitial spread of tumor. Multiple bilateral pulmonary nodules, likely metastatic disease. 8 mm left lower lobe pulmonary nodule on image number 108 was present on the prior CT scan from 2016 but is slightly enlarged. This could be a synchronous primary. Upper Abdomen: Bilateral adrenal gland nodules   appears stable since 2016. No obvious hepatic metastatic lesions Musculoskeletal: No significant bony findings. No obvious bone lesions to suggest metastatic disease. IMPRESSION: 1. 5 cm right lower lobe lung mass with confluent right hilar and infrahilar tumor and ipsilateral and contralateral mediastinal lymphadenopathy. 2. Obstructive pneumonia and probable interstitial spread of tumor. 3. Pulmonary metastatic disease. 4. No obvious upper abdominal metastatic disease or osseous metastatic disease. 5. Severe underlying emphysema. 6. Bilateral adrenal gland nodules, unchanged since 2016 and likely adenomas. Electronically Signed   By: P.  Gallerani M.D.   On: 11/19/2016 12:16   Ct Angio Chest Pe W And/or Wo Contrast  Result Date: 11/20/2016 CLINICAL DATA:  Stage IV lung cancer, hospitalized with pneumonia and recently released. Dyspnea, tachycardia and hypoxia. EXAM: CT ANGIOGRAPHY CHEST WITH CONTRAST TECHNIQUE: Multidetector CT imaging of the chest was performed using the standard protocol during bolus administration of intravenous contrast. Multiplanar CT image reconstructions and MIPs were obtained to evaluate the vascular anatomy. CONTRAST:  75 cc Isovue 370 IV  COMPARISON:  Chest CT from 11/19/2016 FINDINGS: Cardiovascular: Normal cardiac chamber size. No pericardial effusion. Scattered aortic atherosclerosis without aneurysm nor dissection. Advanced three-vessel coronary arteriosclerosis. Mediastinum/Nodes: Stable mediastinal and hilar lymphadenopathy: 14 mm short axis pretracheal lymph node, image 38/series 4, unchanged. 16 mm short axis subcarinal lymph node, image 50/series 4, unchanged. 4 x 2.1 cm right hilar lymphadenopathy, image 46/series 4, no significant change. 14 mm left hilar lymph nodes, image 56/series 4, slightly increased from 11 mm. Lungs/Pleura: Once again there is extensive soft tissue masslike abnormality surrounding the right middle and lower lobe pulmonary arteries, main pulmonary artery, and bronchi to the right middle and lower lobes, more prominent in appearance than on prior exam especially port abuts the right inferior pulmonary vein and right heart border measuring 4.1 x 2.9 cm, image 59/series 4, previously estimated at 2.5 cm. No change with reference to right lower lobe masslike abnormality previously estimated at 5 x 3.5 cm, currently 4.8 x 3.7 cm. Postobstructive pulmonary consolidations have slightly increased in appearance to the right lower lobe and medial right middle lobe. Since prior, slight increase in small right effusion. Right middle, lower lobe and to a lesser degree right upper lobe interstitial thickening is identified, slightly more prominent than on prior and may reflect lymphangitic spread of tumor. Stable multiple subpleural nodules scattered within the right lung and left lung base consistent with metastatic disease. These range in size from 5 mm through 8 mm. Centrilobular emphysema. Upper Abdomen: Stable thickening of both adrenal glands. No obvious hepatic metastatic disease. Musculoskeletal: No apparent lytic or blastic disease to suggest osseous metastasis. Review of the MIP images confirms the above findings.  IMPRESSION: 1. Slight increase in small right pleural effusion with further thickening coarsening of interstitium suggestive of lymphangitic spread of tumor. The primary mass in the right lower lobe is not significantly changed however masslike lymphadenopathy about the hila are slightly increased in size since prior. 2. Some progression of postobstructive pulmonary consolidation and pneumonia since prior exam to the right middle and lower lobes. 3. Metastatic subcentimeter pulmonary nodules bilaterally without significant change. 4. COPD. Electronically Signed   By: David  Kwon M.D.   On: 11/26/2016 20:04   Mr Brain W Wo Contrast  Result Date: 11/20/2016 CLINICAL DATA:  75-year-old male with increasing cough and shortness of Breath. Chest imaging shows evidence of right lung mass, hilar tumor, and pulmonary metastatic disease. EXAM: MRI HEAD WITHOUT AND WITH CONTRAST TECHNIQUE: Multiplanar, multiecho pulse sequences of the   brain and surrounding structures were obtained without and with intravenous contrast. CONTRAST:  11mL MULTIHANCE GADOBENATE DIMEGLUMINE 529 MG/ML IV SOLN COMPARISON:  Brain MRI without contrast 05/08/2016 FINDINGS: Brain: No abnormal or suspicious enhancement of the brain on axial or coronal postcontrast images. However, on sagittal post-contrast images (series 13 images 11 and 17) there are 2 small 1-2 mm foci of nodular enhancement. I favor these are vascular related rather than very small brain metastases. There is pulsation artifact in the posterior fossa on sagittal and coronal post-contrast images. No dural thickening. No restricted diffusion to suggest acute infarction. No midline shift, mass effect, evidence of mass lesion, ventriculomegaly, extra-axial collection or acute intracranial hemorrhage. Pituitary within normal limits. Nonspecific cerebral white matter T2 and FLAIR hyperintensity is mild to moderate for age. No cortical encephalomalacia or chronic cerebral blood products.  There are several tiny chronic lacunar infarcts in the cerebellar hemispheres, more so the left. Vascular: Major intracranial vascular flow voids are stable and within normal limits. Skull and upper cervical spine: Advanced degenerative changes in the cervical spine including ligamentous hypertrophy about the odontoid and multilevel spondylolisthesis. There is some associated degenerative cervicomedullary junction and spinal stenosis but otherwise negative visualized cervical spinal cord. Visualized bone marrow signal is within normal limits. Sinuses/Orbits: Negative orbits soft tissues. Right frontal and ethmoid sinus mucosal thickening and bubbly opacity. Other Visualized paranasal sinuses and mastoids are well pneumatized. Other: Visible internal auditory structures appear normal. Negative scalp soft tissues. IMPRESSION: 1. Difficult to exclude two very early brain metastases in both superior frontal lobes, but favor instead benign vascular related enhancement. Repeat Brain MRI without and with contrast recommended in 6-8 weeks (ideally an SRS protocol study performed on a 3 Tesla MRI unit). 2. Otherwise no metastatic disease or acute intracranial abnormality. 3. Advanced cervical spine degeneration. Electronically Signed   By: H  Hall M.D.   On: 11/20/2016 10:47   Nm Bone Scan Whole Body  Result Date: 11/20/2016 CLINICAL DATA:  History of lung mass EXAM: NUCLEAR MEDICINE WHOLE BODY BONE SCAN TECHNIQUE: Whole body anterior and posterior images were obtained approximately 3 hours after intravenous injection of radiopharmaceutical. RADIOPHARMACEUTICALS:  23.38 mCi Technetium-99m MDP IV COMPARISON:  None. FINDINGS: There is adequate uptake of radioactive tracer throughout the bony skeleton. Bilateral renal activity is noted. Scattered areas of increased activity are identified in the rib cage bilaterally both anteriorly and posteriorly as well as within the iliac bones adjacent to the sacroiliac joint as well as  the left ischial tuberosity. Changes in the sternum and to a lesser degree in the thoracic and lumbar spine are noted. These areas are consistent with metastatic disease. Degenerative changes are noted in the knee joints and tarsal bones as well as the shoulder joints bilaterally. Changes of prior hip replacement are noted. IMPRESSION: Diffuse osseous metastatic disease as described. Electronically Signed   By: Mark  Lukens M.D.   On: 11/20/2016 11:50   Ct Abdomen Pelvis W Contrast  Result Date: 11/20/2016 CLINICAL DATA:  Abnormal CT chest demonstrating 5 cm RIGHT lower lobe mass with confluent hilar/mediastinal adenopathy, pulmonary nodules, and interstitial infiltrates question lymphangitic spread of tumor EXAM: CT ABDOMEN AND PELVIS WITH CONTRAST TECHNIQUE: Multidetector CT imaging of the abdomen and pelvis was performed using the standard protocol following bolus administration of intravenous contrast. Sagittal and coronal MPR images reconstructed from axial data set. CONTRAST:  75mL ISOVUE-300 IOPAMIDOL (ISOVUE-300) INJECTION 61% IV. Dilute oral contrast. COMPARISON:  CT abdomen pelvis 05/06/2016 FINDINGS: Lower chest: 5.3 x 3.9   cm RIGHT lower lobe mass with confluent hilar and infrahilar adenopathy. Diffuse interstitial infiltrates throughout the visualized RIGHT middle and RIGHT lower lobes question lymphangitic tumor spread. LEFT basilar pulmonary nodule 9 mm diameter. Central peribronchial thickening. Hepatobiliary: Depending calculi within gallbladder. Liver and gallbladder otherwise normal appearance. Pancreas: Normal appearance Spleen: Normal appearance Adrenals/Urinary Tract: Mild diffuse low attenuation nodular thickening of both adrenal glands unchanged. Tiny nonobstructing LEFT renal calculi. BILATERAL renal cortical thinning. Deformity of the lateral margin of the LEFT kidney, new since previous exam likely representing a small subcapsular fluid collection 2.0 x 1.1 cm image 33. No hydronephrosis  or hydroureter. Bladder poorly visualized due to beam hardening artifacts in pelvis, though bladder contains excreted contrast material. Prostate gland obscured. Stomach/Bowel: Normal appendix. Descending and sigmoid diverticulosis. Portions of pelvic bowel loops obscured by beam hardening artifacts. Stomach and bowel loops otherwise unremarkable. Vascular/Lymphatic: Atherosclerotic calcifications aorta and iliac arteries. Significant calcified plaque at the origins of the renal arteries bilaterally question renal artery stenosis. Additional significant plaque at the origins of the SMA and IMA. Coronary toe calcifications. Mitral annular calcifications. No definite adenopathy. Reproductive: N/A Other: No free air free fluid. Musculoskeletal: Significant beam hardening artifacts in pelvis from BILATERAL hip prostheses. Osseous demineralization. Degenerative disc disease changes lumbar spine. No acute bony findings. IMPRESSION: RIGHT lower lobe neoplasm with extensive RIGHT hilar and infrahilar adenopathy as well as probable lymphangitic tumor spread throughout RIGHT lung. LEFT lower lobe pulmonary nodule. No definite intra-abdominal or intrapelvic metastases. Cholelithiasis. Aortic atherosclerosis and coronary arterial calcifications with significant stenoses at the origins of the renal arteries and SMA. Limitations of pelvic imaging secondary to beam hardening artifacts from BILATERAL hip prostheses. Electronically Signed   By: Lavonia Dana M.D.   On: 11/20/2016 11:58   US Biopsy  Result Date: 11/21/2016 CLINICAL DATA:  Right lower lobe lung mass with metastatic lymphadenopathy to the mediastinum, right hilum and right supraclavicular region by prior imaging. EXAM: ULTRASOUND GUIDED CORE BIOPSY OF RIGHT SUPRACLAVICULAR LYMPH NODE MEDICATIONS: 0.5 mg IV Versed; 25 mcg IV Fentanyl Total Moderate Sedation Time: 17 minutes. The patient's level of consciousness and physiologic status were continuously monitored during  the procedure by Radiology nursing. PROCEDURE: The procedure, risks, benefits, and alternatives were explained to the patient. Questions regarding the procedure were encouraged and answered. The patient understands and consents to the procedure. A time out was performed prior to initiating the procedure. The right neck was prepped with chlorhexidine in a sterile fashion, and a sterile drape was applied covering the operative field. A sterile gown and sterile gloves were used for the procedure. Local anesthesia was provided with 1% Lidocaine. Right supraclavicular/lower cervical lymph nodes were localized. Four 18 gauge core biopsy samples were obtained through a supraclavicular lymph node. COMPLICATIONS: None. FINDINGS: Three abnormal appearing right-sided supraclavicular lymph nodes are identified. The largest measures approximately 1.1 cm. Core biopsy was obtained through this lymph node. IMPRESSION: Ultrasound-guided core biopsy performed of an abnormal right supraclavicular lymph node. Electronically Signed   By: Aletta Edouard M.D.   On: 11/21/2016 17:07   Dg Chest Portable 1 View  Result Date: 12/02/2016 CLINICAL DATA:  Worsening shortness of breath and difficulty breathing today. History of COPD. Stage IV lung cancer. EXAM: PORTABLE CHEST 1 VIEW COMPARISON:  11/19/2016.  05/06/2016. FINDINGS: Advanced pattern of widespread emphysema. Extensive tumor in the right lower lobe and hilar region with lymphangitic infiltration throughout most of the right lung. This appears quite similar allowing for technical differences. There could be coexistent  pulmonary inflammation. Fibrotic pattern at the left base as seen previously. IMPRESSION: No radiographic change since 11/19/2016. Advanced tumor in the right lower lobe and hilar region with lymphangitic infiltration of the right lung. Advanced background emphysema. There could be coexistent inflammatory change, but that is not specifically delineated.  Electronically Signed   By: Nelson Chimes M.D.   On: 12/09/2016 17:53   Dg Chest Portable 1 View  Result Date: 11/19/2016 CLINICAL DATA:  Cough and shortness of breath for 2 weeks.  COPD. EXAM: PORTABLE CHEST 1 VIEW COMPARISON:  05/06/2016 FINDINGS: Right infrahilar mass like appearance with a density in this region measuring about 6.0 by 4.8 cm. Surrounding airspace opacities and coarse interstitial opacities primarily in the right lower lobe and right middle lobe. Faint interstitial accentuation at the left lung base. Severe emphysema. Tortuous thoracic aorta. Severe degenerative glenohumeral arthropathy bilaterally. IMPRESSION: 1. Right infrahilar masslike appearance suspicious for malignancy. Conceivably this could be due to more focal consolidation in the setting of background coarse interstitial accentuation and airspace opacities in the right lower lobe and right middle lobe, rather than necessarily being from malignancy. Chest CT (with contrast if feasible) is recommended for further characterization. 2. Severe emphysema. 3. Tortuous thoracic aorta. 4. Severe degenerative glenohumeral arthropathy bilaterally. Electronically Signed   By: Van Clines M.D.   On: 11/19/2016 10:45    Assessment and plan- Patient is a 76 y.o. 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 I again reviewed the results of ct scan with them. Patient does not comprehend the extent of his disease and says "I am ok". His daughter Jordan Hawks has been his caretaker and only family member other than his stepson. Patient has advanced lung cancer and end stage COPD both of which are life limiting. He is not a candidate for palliative chemotherapy while he is hospitalized. If his condition does not improve after treating underlying pneumonia, hospice would be reasonable option to  consider. I will discuss this further with palliative care in the morning. I did tell them that if her breathing were to get worse, Intubation would not be my recommendation as it would not improve his QOL or longevity. Patients daughter would like to think about these options today. Patient is anxious and says he does not want to suffer. Would recommend iv prn ativan and prn morphine for palliation of his dyspnea.   I will see him tomorrow morning. His prognosis is poor   Thank you for this kind referral and the opportunity to participate in the care of this patient   Total face to face encounter time for this patient visit was 45 min. >50% of the time was  spent in counseling and coordination of care. I also reviewed his images personally    Visit Diagnosis 1. COPD exacerbation ([place])   2. HCAP (healthcare-associated pneumonia)   3. Acute on chronic respiratory failure with hypoxia (HCC)   4. Anemia, unspecified type   5. Respiratory failure ([place])     Dr. Randa Evens, MD, MPH [facility] Pager- [phone] 11/28/2016 4:51 PM

## 2016-11-28 NOTE — Progress Notes (Addendum)
Palliative Medicine Team  This NP briefly met with patient this morning to discuss diagnosis, GOC, and advanced directives/code status.   No family at bedside. Patient alert and oriented. No signs or symptoms of distress. Patient on NRB mask during our conversation.  Patient able to participate in the conversation but does not seem to fully understand education regarding code status and EOL wishes.   Dr. Janese Banks plans to see patient this evening. I have spoken with daughter, Jonathan Howell, via telephone and plan to meet with her tomorrow, 2/16 at 8am to further discuss diagnosis, prognosis, GOC, code status, EOL wishes, and options.   NO CHARGE  Ihor Dow, FNP-C Palliative Medicine Team  Phone: [phone] Fax: [phone]

## 2016-11-28 NOTE — Progress Notes (Signed)
Pt had episode of confusion. Pt was sitting on side of bed. Condom cath had been removed. Pt was urinating in the floor. Pt cleaned up, returned to bed. Bed alarm is on, bed in lowest position will continue to assess.

## 2016-11-28 NOTE — Progress Notes (Signed)
Visit made. Patient is a recent admit (2/10) to [facility] receiving the services of skilled nursing and Physical therapy. He was recently discharged from [facility] on 2/9 and has a new diagnosis of metastatic NSCLC. He was re-admitted on 2/14 for treatment of increased shortness of breath, required Bipap and is now on a nonrebreather. Patient seen lying in bed, daughter Sharilyn Sites at bedside, Probation officer explained her role as Scientist, research (physical sciences). Patient appeared to recall our meeting of 2/9. [name] explained that she could not "get his breathing under control" yesterday and felt she had to take him to the ED. Writer provided emotional support. [name] confirmed meeting with Palliative Medicine NP Ihor Dow for tomorrow. Patient did not appear to be in any distress. . Will continue to follow and update [facility] team. CMRN Joni Reining aware that patient is a current [facility] patient. Thank you. Flo Shanks RN, BSN, [facility] Liaison [phone] c

## 2016-11-28 NOTE — Care Management Note (Signed)
Case Management Note  Patient Details  Name: Jonathan Howell MRN: [id number] Date of Birth: 1941-06-11  Admitted to ICU stepdown due to need for continuous bipap. Patient was discharged from [facility] 2/9 with chronic 02 which was increased to 6 liters at time of discharge.  He is admitted with  exacerbation of COPD and stage IV adenocarcinoma w mets.   Expected Discharge Date:                  Expected Discharge Plan:  [place] (Resume [facility])  In-House Referral:     Discharge planning Services  CM Consult  Post Acute Care Choice:    Choice offered to:     DME Arranged:    DME Agency:     HH Arranged:    HH Agency:     Status of Service:  In process, will continue to follow  If discussed at Long Length of Stay Meetings, dates discussed:    Additional Comments:  Katrina Stack, RN 11/28/2016, 9:58 AM

## 2016-11-28 NOTE — Progress Notes (Signed)
Pt is in stable condition at this time. Pt remains confused to time and situation. Dr. Janese Banks visited with pt and family, explained care. Palliative to meet with family tomorrow in the AM. Pt has had no complaints of pain on my shift.Pt has voided 4 times on my shift. Report given to oncoming RN. Full assessment in EPIC.

## 2016-11-28 NOTE — Plan of Care (Signed)
Problem: Education: Goal: Knowledge of [facility] Education information/materials will improve Outcome: Progressing Pt family provided with welcome packet. Password established. Pt family had opportunity to ask questions and were oriented to unit and staff.    Problem: Safety: Goal: Ability to remain free from injury will improve Outcome: Progressing Pt remained free from falls on my shift. Pt had bed and chair alarm on, bed in lowest position with frequent verbal contacts.  Problem: Pain Managment: Goal: General experience of comfort will improve Outcome: Progressing Pt has had a decrease in anxiety on my shift. No complaints of pain on my shift. Pt reports more comfortable breathing patterns.

## 2016-11-29 ENCOUNTER — Inpatient Hospital Stay: Payer: Medicare Other

## 2016-11-29 ENCOUNTER — Inpatient Hospital Stay: Payer: Medicare Other | Admitting: Oncology

## 2016-11-29 DIAGNOSIS — C7800 Secondary malignant neoplasm of unspecified lung: Secondary | ICD-10-CM

## 2016-11-29 DIAGNOSIS — C771 Secondary and unspecified malignant neoplasm of intrathoracic lymph nodes: Secondary | ICD-10-CM

## 2016-11-29 DIAGNOSIS — C7931 Secondary malignant neoplasm of brain: Secondary | ICD-10-CM

## 2016-11-29 DIAGNOSIS — Z515 Encounter for palliative care: Secondary | ICD-10-CM

## 2016-11-29 DIAGNOSIS — J45901 Unspecified asthma with (acute) exacerbation: Secondary | ICD-10-CM

## 2016-11-29 DIAGNOSIS — F17210 Nicotine dependence, cigarettes, uncomplicated: Secondary | ICD-10-CM

## 2016-11-29 DIAGNOSIS — Z66 Do not resuscitate: Secondary | ICD-10-CM

## 2016-11-29 LAB — BASIC METABOLIC PANEL
ANION GAP: 7 (ref 5–15)
BUN: 39 mg/dL — ABNORMAL HIGH (ref 6–20)
CALCIUM: 7.9 mg/dL — AB (ref 8.9–10.3)
CHLORIDE: 110 mmol/L (ref 101–111)
CO2: 23 mmol/L (ref 22–32)
Creatinine, Ser: 1.42 mg/dL — ABNORMAL HIGH (ref 0.61–1.24)
GFR calc non Af Amer: 47 mL/min — ABNORMAL LOW (ref >60)
GFR, EST AFRICAN AMERICAN: 55 mL/min — AB (ref >60)
Glucose, Bld: 105 mg/dL — ABNORMAL HIGH (ref 65–99)
Potassium: 3.6 mmol/L (ref 3.5–5.1)
SODIUM: 140 mmol/L (ref 135–145)

## 2016-11-29 LAB — URINE CULTURE: Culture: NO GROWTH

## 2016-11-29 LAB — BLOOD GAS, VENOUS
Acid-base deficit: 3.6 mmol/L — ABNORMAL HIGH (ref 0.0–2.0)
Bicarbonate: 23.1 mmol/L (ref 20.0–28.0)
PCO2 VEN: 47 mmHg (ref 44.0–60.0)
PH VEN: 7.3 (ref 7.250–7.430)
Patient temperature: 37

## 2016-11-29 LAB — TYPE AND SCREEN
BLOOD PRODUCT EXPIRATION DATE: 201803082359
ISSUE DATE / TIME: 201802150033
Unit Type and Rh: 6200

## 2016-11-29 LAB — CBC
HCT: 22 % — ABNORMAL LOW (ref 40.0–52.0)
HEMOGLOBIN: 7.1 g/dL — AB (ref 13.0–18.0)
MCH: 27.5 pg (ref 26.0–34.0)
MCHC: 32 g/dL (ref 32.0–36.0)
MCV: 85.7 fL (ref 80.0–100.0)
Platelets: 162 10*3/uL (ref 150–440)
RBC: 2.57 MIL/uL — ABNORMAL LOW (ref 4.40–5.90)
RDW: 19.4 % — ABNORMAL HIGH (ref 11.5–14.5)
WBC: 14.1 10*3/uL — AB (ref 3.8–10.6)

## 2016-11-29 MED ORDER — MORPHINE SULFATE (CONCENTRATE) 10 MG/0.5ML PO SOLN
5.0000 mg | ORAL | Status: DC
  Administered 2016-11-29 – 2016-12-01 (×16): 5 mg via ORAL
  Filled 2016-11-29 (×16): qty 1

## 2016-11-29 MED ORDER — DOCUSATE SODIUM 100 MG PO CAPS: 100.0000 mg | ORAL_CAPSULE | Freq: Two times a day (BID) | ORAL | Status: DC | PRN

## 2016-11-29 MED ORDER — LORAZEPAM 0.5 MG PO TABS: 0.5000 mg | ORAL_TABLET | ORAL | Status: DC | PRN

## 2016-11-29 MED ORDER — MORPHINE SULFATE (CONCENTRATE) 10 MG/0.5ML PO SOLN
5.0000 mg | ORAL | Status: DC | PRN
  Administered 2016-11-29 – 2016-12-01 (×8): 5 mg via SUBLINGUAL
  Filled 2016-11-29 (×8): qty 1

## 2016-11-29 MED ORDER — GLYCOPYRROLATE 0.2 MG/ML IJ SOLN
0.2000 mg | INTRAMUSCULAR | Status: DC | PRN
  Filled 2016-11-29: qty 1

## 2016-11-29 MED ORDER — MORPHINE SULFATE (PF) 4 MG/ML IV SOLN
1.0000 mg | INTRAVENOUS | Status: DC | PRN
  Administered 2016-11-29 – 2016-12-01 (×3): 1 mg via INTRAVENOUS
  Filled 2016-11-29 (×3): qty 1

## 2016-11-29 NOTE — Progress Notes (Signed)
Visit made. Patient seen lying in bed, now on nasal cannula, appears comfortable, scheduled liquid morphine ordered for management of dyspnea. Several family members at bedside as patient was made comfort care this morning following the meeting with Palliative Medicine NP Ihor Dow. Emotional support offered. [facility] team updated. Will continue to follow through final disposition. Possible hospital death.  Flo Shanks RN, BSN, [facility], hospital liaison [phone] c

## 2016-11-29 NOTE — Progress Notes (Signed)
Pt is in stable condition. No complaints of pain or SOB. Report called to [facility] Pt to be transferred to room 108. Family at bedside, notified.

## 2016-11-29 NOTE — Progress Notes (Signed)
Hematology/Oncology Consult note [facility]  Telephone:([phone] [phone]  Patient Care Team: Petra Kuba, MD as PCP - General (Family Medicine)   Name of the patient: Jonathan Howell  [id number]  September 04, 1973   Date of visit 11/29/2016   Interval history- Patient appeared uncomfortable this morning due to dyspnea. He denies any pain. He has not been able to be weaned down from NRB  ECOG PS- 4 Pain scale- 0   Review of systems- Review of Systems  Unable to perform ROS: Medical condition      No Known Allergies   Past Medical History:  Diagnosis Date  . Asthma   . COPD (chronic obstructive pulmonary disease) ([place])   . Emphysema of lung ([place])   . Hypertension   . Personal history of tobacco use, presenting hazards to health 07/14/2015     Past Surgical History:  Procedure Laterality Date  . bilateral hip replacment surg  2000,and 2015  . EXTRACORPOREAL SHOCK WAVE LITHOTRIPSY Left 05/16/2016   Procedure: EXTRACORPOREAL SHOCK WAVE LITHOTRIPSY (ESWL);  Surgeon: Hollice Espy, MD;  Location: [facility] ORS;  Service: Urology;  Laterality: Left;  . HEMORRHOID SURGERY      Social History   Social History  . Marital status: Widowed    Spouse name: N/A  . Number of children: N/A  . Years of education: N/A   Occupational History  . Not on file.   Social History Main Topics  . Smoking status: Former Smoker    Packs/day: 0.25    Years: 60.00    Quit date: 11/18/2016  . Smokeless tobacco: Never Used     Comment: 1 pack per day  . Alcohol use No     Comment: none x 30 yr per daughter kim  . Drug use: No  . Sexual activity: Yes    Birth control/ protection: None   Other Topics Concern  . Not on file   Social History Narrative   Lives with family. Independent at baseline.    Family History  Problem Relation Age of Onset  . Breast cancer Mother   . Suicidality Father   . Psychiatric Illness Sister      Current  Facility-Administered Medications:  .  acetaminophen (TYLENOL) tablet 650 mg, 650 mg, Oral, Q4H PRN, Bincy S Varughese, NP .  albuterol (PROVENTIL) (2.5 MG/3ML) 0.083% nebulizer solution 2.5 mg, 2.5 mg, Nebulization, Q3H PRN, Wilhelmina Mcardle, MD .  budesonide (PULMICORT) nebulizer solution 0.25 mg, 0.25 mg, Nebulization, Q6H, Wilhelmina Mcardle, MD, 0.25 mg at 11/29/16 0755 .  docusate sodium (COLACE) capsule 100 mg, 100 mg, Oral, BID PRN, Basilio Cairo, NP .  glycopyrrolate (ROBINUL) injection 0.2 mg, 0.2 mg, Intravenous, Q4H PRN, Basilio Cairo, NP .  ipratropium-albuterol (DUONEB) 0.5-2.5 (3) MG/3ML nebulizer solution 3 mL, 3 mL, Nebulization, Q6H, Wilhelmina Mcardle, MD, 3 mL at 11/29/16 0755 .  LORazepam (ATIVAN) tablet 0.5 mg, 0.5 mg, Oral, Q4H PRN, Basilio Cairo, NP .  MEDLINE mouth rinse, 15 mL, Mouth Rinse, BID, Wilhelmina Mcardle, MD, 15 mL at 11/28/16 2125 .  morphine 4 MG/ML injection 1 mg, 1 mg, Intravenous, Q1H PRN, Basilio Cairo, NP, 1 mg at 11/29/16 1134 .  morphine CONCENTRATE 10 MG/0.5ML oral solution 5 mg, 5 mg, Sublingual, Q1H PRN, Basilio Cairo, NP .  morphine CONCENTRATE 10 MG/0.5ML oral solution 5 mg, 5 mg, Oral, Q4H, Basilio Cairo, NP, 5 mg at 11/29/16 0939 .  traZODone (DESYREL)  tablet 150 mg, 150 mg, Oral, QHS, Wilhelmina Mcardle, MD, 150 mg at 11/28/16 2122  Physical exam:  Vitals:   11/29/16 0500 11/29/16 0700 11/29/16 0800 11/29/16 0900  BP:  (!) 157/97 (!) 138/116 (!) 151/88  Pulse:  (!) 110  77  Resp:  (!) _0 Temp:    97.1 F (36.2 C)  TempSrc:    Axillary  SpO2:  94%  91%  Weight: 143 lb 15.4 oz (65.3 kg)     Height:       Physical Exam  Constitutional: He is oriented to person, place, and time.  Elderly male appears in mild distress from sob  HENT:  Head: Normocephalic and atraumatic.  Eyes: EOM are normal. Pupils are equal, round, and reactive to light.  Neck: Normal range of motion.  Cardiovascular: Normal rate, regular rhythm and normal heart sounds.     Pulmonary/Chest:  Breath sounds decreased b/l diffusely  Abdominal: Soft. Bowel sounds are normal.  Neurological: He is alert and oriented to person, place, and time.  Skin: Skin is warm and dry.     CMP Latest Ref Rng & Units 11/29/2016  Glucose 65 - 99 mg/dL 105(H)  BUN 6 - 20 mg/dL 39(H)  Creatinine 0.61 - 1.24 mg/dL 1.42(H)  Sodium 135 - 145 mmol/L 140  Potassium 3.5 - 5.1 mmol/L 3.6  Chloride 101 - 111 mmol/L 110  CO2 22 - 32 mmol/L 23  Calcium 8.9 - 10.3 mg/dL 7.9(L)  Total Protein 6.5 - 8.1 g/dL -  Total Bilirubin 0.3 - 1.2 mg/dL -  Alkaline Phos 38 - 126 U/L -  AST 15 - 41 U/L -  ALT 17 - 63 U/L -   CBC Latest Ref Rng & Units 11/29/2016  WBC 3.8 - 10.6 K/uL 14.1(H)  Hemoglobin 13.0 - 18.0 g/dL 7.1(L)  Hematocrit 40.0 - 52.0 % 22.0(L)  Platelets 150 - 440 K/uL 162    _1 @  Ct Chest W Contrast  Result Date: 11/19/2016 CLINICAL DATA:  Cough and shortness of breath for 3 weeks. EXAM: CT CHEST WITH CONTRAST TECHNIQUE: Multidetector CT imaging of the chest was performed during intravenous contrast administration. CONTRAST:  76m ISOVUE-300 IOPAMIDOL (ISOVUE-300) INJECTION 61% COMPARISON:  Chest x-ray 11/19/2016 and chest CT 02/28/2015 FINDINGS: Chest wall: No chest wall mass or axillary adenopathy. 8.5 mm supraclavicular node noted on the right side on image 4. The thyroid gland appears normal. Cardiovascular: The heart is normal in size. No pericardial effusion. Scattered aortic calcifications but no aneurysm or dissection. Advanced three-vessel coronary artery calcifications. Mediastinum/Nodes: Extensive mediastinal and right hilar/ infrahilar lymphadenopathy. 14 mm pretracheal node on image number 53. 3.7 x 2.3 cm right hilar adenopathy. 17 mm subcarinal lymph node. 11 mm left infrahilar lymph node. Extensive tumor surrounding the right middle lobe pulmonary artery, main pulmonary artery and right lower lobe pulmonary artery and right lower lobe and middle lobe bronchi. The  esophagus is grossly normal. Lungs/Pleura: 5 x 3.5 cm right lower lobe lung mass with adjacent confirm 1 infrahilar and hilar tumor. There is also obstructive pneumonitis and probable interstitial spread of tumor. Multiple bilateral pulmonary nodules, likely metastatic disease. 8 mm left lower lobe pulmonary nodule on image number 108 was present on the prior CT scan from 2016 but is slightly enlarged. This could be a synchronous primary. Upper Abdomen: Bilateral adrenal gland nodules appears stable since 2016. No obvious hepatic metastatic lesions Musculoskeletal: No significant bony findings. No obvious bone lesions to suggest metastatic disease. IMPRESSION:  1. 5 cm right lower lobe lung mass with confluent right hilar and infrahilar tumor and ipsilateral and contralateral mediastinal lymphadenopathy. 2. Obstructive pneumonia and probable interstitial spread of tumor. 3. Pulmonary metastatic disease. 4. No obvious upper abdominal metastatic disease or osseous metastatic disease. 5. Severe underlying emphysema. 6. Bilateral adrenal gland nodules, unchanged since 2016 and likely adenomas. Electronically Signed   By: Marijo Sanes M.D.   On: 11/19/2016 12:16   Ct Angio Chest Pe W And/or Wo Contrast  Result Date: 11/19/2016 CLINICAL DATA:  Stage IV lung cancer, hospitalized with pneumonia and recently released. Dyspnea, tachycardia and hypoxia. EXAM: CT ANGIOGRAPHY CHEST WITH CONTRAST TECHNIQUE: Multidetector CT imaging of the chest was performed using the standard protocol during bolus administration of intravenous contrast. Multiplanar CT image reconstructions and MIPs were obtained to evaluate the vascular anatomy. CONTRAST:  75 cc Isovue 370 IV COMPARISON:  Chest CT from 11/19/2016 FINDINGS: Cardiovascular: Normal cardiac chamber size. No pericardial effusion. Scattered aortic atherosclerosis without aneurysm nor dissection. Advanced three-vessel coronary arteriosclerosis. Mediastinum/Nodes: Stable mediastinal  and hilar lymphadenopathy: 14 mm short axis pretracheal lymph node, image 38/series 4, unchanged. 16 mm short axis subcarinal lymph node, image 50/series 4, unchanged. 4 x 2.1 cm right hilar lymphadenopathy, image 46/series 4, no significant change. 14 mm left hilar lymph nodes, image 56/series 4, slightly increased from 11 mm. Lungs/Pleura: Once again there is extensive soft tissue masslike abnormality surrounding the right middle and lower lobe pulmonary arteries, main pulmonary artery, and bronchi to the right middle and lower lobes, more prominent in appearance than on prior exam especially port abuts the right inferior pulmonary vein and right heart border measuring 4.1 x 2.9 cm, image 59/series 4, previously estimated at 2.5 cm. No change with reference to right lower lobe masslike abnormality previously estimated at 5 x 3.5 cm, currently 4.8 x 3.7 cm. Postobstructive pulmonary consolidations have slightly increased in appearance to the right lower lobe and medial right middle lobe. Since prior, slight increase in small right effusion. Right middle, lower lobe and to a lesser degree right upper lobe interstitial thickening is identified, slightly more prominent than on prior and may reflect lymphangitic spread of tumor. Stable multiple subpleural nodules scattered within the right lung and left lung base consistent with metastatic disease. These range in size from 5 mm through 8 mm. Centrilobular emphysema. Upper Abdomen: Stable thickening of both adrenal glands. No obvious hepatic metastatic disease. Musculoskeletal: No apparent lytic or blastic disease to suggest osseous metastasis. Review of the MIP images confirms the above findings. IMPRESSION: 1. Slight increase in small right pleural effusion with further thickening coarsening of interstitium suggestive of lymphangitic spread of tumor. The primary mass in the right lower lobe is not significantly changed however masslike lymphadenopathy about the hila  are slightly increased in size since prior. 2. Some progression of postobstructive pulmonary consolidation and pneumonia since prior exam to the right middle and lower lobes. 3. Metastatic subcentimeter pulmonary nodules bilaterally without significant change. 4. COPD. Electronically Signed   By: Ashley Royalty M.D.   On: 11/26/2016 20:04   Mr Jeri Cos TM Contrast  Result Date: 11/20/2016 CLINICAL DATA:  76 year old male with increasing cough and shortness of Breath. Chest imaging shows evidence of right lung mass, hilar tumor, and pulmonary metastatic disease. EXAM: MRI HEAD WITHOUT AND WITH CONTRAST TECHNIQUE: Multiplanar, multiecho pulse sequences of the brain and surrounding structures were obtained without and with intravenous contrast. CONTRAST:  76m MULTIHANCE GADOBENATE DIMEGLUMINE 529 MG/ML IV SOLN COMPARISON:  Brain MRI without contrast 05/08/2016 FINDINGS: Brain: No abnormal or suspicious enhancement of the brain on axial or coronal postcontrast images. However, on sagittal post-contrast images (series 13 images 11 and 17) there are 2 small 1-2 mm foci of nodular enhancement. I favor these are vascular related rather than very small brain metastases. There is pulsation artifact in the posterior fossa on sagittal and coronal post-contrast images. No dural thickening. No restricted diffusion to suggest acute infarction. No midline shift, mass effect, evidence of mass lesion, ventriculomegaly, extra-axial collection or acute intracranial hemorrhage. Pituitary within normal limits. Nonspecific cerebral white matter T2 and FLAIR hyperintensity is mild to moderate for age. No cortical encephalomalacia or chronic cerebral blood products. There are several tiny chronic lacunar infarcts in the cerebellar hemispheres, more so the left. Vascular: Major intracranial vascular flow voids are stable and within normal limits. Skull and upper cervical spine: Advanced degenerative changes in the cervical spine including  ligamentous hypertrophy about the odontoid and multilevel spondylolisthesis. There is some associated degenerative cervicomedullary junction and spinal stenosis but otherwise negative visualized cervical spinal cord. Visualized bone marrow signal is within normal limits. Sinuses/Orbits: Negative orbits soft tissues. Right frontal and ethmoid sinus mucosal thickening and bubbly opacity. Other Visualized paranasal sinuses and mastoids are well pneumatized. Other: Visible internal auditory structures appear normal. Negative scalp soft tissues. IMPRESSION: 1. Difficult to exclude two very early brain metastases in both superior frontal lobes, but favor instead benign vascular related enhancement. Repeat Brain MRI without and with contrast recommended in 6-8 weeks (ideally an SRS protocol study performed on a 3 Tesla MRI unit). 2. Otherwise no metastatic disease or acute intracranial abnormality. 3. Advanced cervical spine degeneration. Electronically Signed   By: Genevie Ann M.D.   On: 11/20/2016 10:47   Nm Bone Scan Whole Body  Result Date: 11/20/2016 CLINICAL DATA:  History of lung mass EXAM: NUCLEAR MEDICINE WHOLE BODY BONE SCAN TECHNIQUE: Whole body anterior and posterior images were obtained approximately 3 hours after intravenous injection of radiopharmaceutical. RADIOPHARMACEUTICALS:  23.38 mCi Technetium-68mMDP IV COMPARISON:  None. FINDINGS: There is adequate uptake of radioactive tracer throughout the bony skeleton. Bilateral renal activity is noted. Scattered areas of increased activity are identified in the rib cage bilaterally both anteriorly and posteriorly as well as within the iliac bones adjacent to the sacroiliac joint as well as the left ischial tuberosity. Changes in the sternum and to a lesser degree in the thoracic and lumbar spine are noted. These areas are consistent with metastatic disease. Degenerative changes are noted in the knee joints and tarsal bones as well as the shoulder joints  bilaterally. Changes of prior hip replacement are noted. IMPRESSION: Diffuse osseous metastatic disease as described. Electronically Signed   By: MInez CatalinaM.D.   On: 11/20/2016 11:50   Ct Abdomen Pelvis W Contrast  Result Date: 11/20/2016 CLINICAL DATA:  Abnormal CT chest demonstrating 5 cm RIGHT lower lobe mass with confluent hilar/mediastinal adenopathy, pulmonary nodules, and interstitial infiltrates question lymphangitic spread of tumor EXAM: CT ABDOMEN AND PELVIS WITH CONTRAST TECHNIQUE: Multidetector CT imaging of the abdomen and pelvis was performed using the standard protocol following bolus administration of intravenous contrast. Sagittal and coronal MPR images reconstructed from axial data set. CONTRAST:  765mISOVUE-300 IOPAMIDOL (ISOVUE-300) INJECTION 61% IV. Dilute oral contrast. COMPARISON:  CT abdomen pelvis 05/06/2016 FINDINGS: Lower chest: 5.3 x 3.9 cm RIGHT lower lobe mass with confluent hilar and infrahilar adenopathy. Diffuse interstitial infiltrates throughout the visualized RIGHT middle and RIGHT lower lobes  question lymphangitic tumor spread. LEFT basilar pulmonary nodule 9 mm diameter. Central peribronchial thickening. Hepatobiliary: Depending calculi within gallbladder. Liver and gallbladder otherwise normal appearance. Pancreas: Normal appearance Spleen: Normal appearance Adrenals/Urinary Tract: Mild diffuse low attenuation nodular thickening of both adrenal glands unchanged. Tiny nonobstructing LEFT renal calculi. BILATERAL renal cortical thinning. Deformity of the lateral margin of the LEFT kidney, new since previous exam likely representing a small subcapsular fluid collection 2.0 x 1.1 cm image 33. No hydronephrosis or hydroureter. Bladder poorly visualized due to beam hardening artifacts in pelvis, though bladder contains excreted contrast material. Prostate gland obscured. Stomach/Bowel: Normal appendix. Descending and sigmoid diverticulosis. Portions of pelvic bowel loops  obscured by beam hardening artifacts. Stomach and bowel loops otherwise unremarkable. Vascular/Lymphatic: Atherosclerotic calcifications aorta and iliac arteries. Significant calcified plaque at the origins of the renal arteries bilaterally question renal artery stenosis. Additional significant plaque at the origins of the SMA and IMA. Coronary toe calcifications. Mitral annular calcifications. No definite adenopathy. Reproductive: N/A Other: No free air free fluid. Musculoskeletal: Significant beam hardening artifacts in pelvis from BILATERAL hip prostheses. Osseous demineralization. Degenerative disc disease changes lumbar spine. No acute bony findings. IMPRESSION: RIGHT lower lobe neoplasm with extensive RIGHT hilar and infrahilar adenopathy as well as probable lymphangitic tumor spread throughout RIGHT lung. LEFT lower lobe pulmonary nodule. No definite intra-abdominal or intrapelvic metastases. Cholelithiasis. Aortic atherosclerosis and coronary arterial calcifications with significant stenoses at the origins of the renal arteries and SMA. Limitations of pelvic imaging secondary to beam hardening artifacts from BILATERAL hip prostheses. Electronically Signed   By: Lavonia Dana M.D.   On: 11/20/2016 11:58   US Biopsy  Result Date: 11/21/2016 CLINICAL DATA:  Right lower lobe lung mass with metastatic lymphadenopathy to the mediastinum, right hilum and right supraclavicular region by prior imaging. EXAM: ULTRASOUND GUIDED CORE BIOPSY OF RIGHT SUPRACLAVICULAR LYMPH NODE MEDICATIONS: 0.5 mg IV Versed; 25 mcg IV Fentanyl Total Moderate Sedation Time: 17 minutes. The patient's level of consciousness and physiologic status were continuously monitored during the procedure by Radiology nursing. PROCEDURE: The procedure, risks, benefits, and alternatives were explained to the patient. Questions regarding the procedure were encouraged and answered. The patient understands and consents to the procedure. A time out was  performed prior to initiating the procedure. The right neck was prepped with chlorhexidine in a sterile fashion, and a sterile drape was applied covering the operative field. A sterile gown and sterile gloves were used for the procedure. Local anesthesia was provided with 1% Lidocaine. Right supraclavicular/lower cervical lymph nodes were localized. Four 18 gauge core biopsy samples were obtained through a supraclavicular lymph node. COMPLICATIONS: None. FINDINGS: Three abnormal appearing right-sided supraclavicular lymph nodes are identified. The largest measures approximately 1.1 cm. Core biopsy was obtained through this lymph node. IMPRESSION: Ultrasound-guided core biopsy performed of an abnormal right supraclavicular lymph node. Electronically Signed   By: Aletta Edouard M.D.   On: 11/21/2016 17:07   Dg Chest Port 1 View  Result Date: 11/29/2016 CLINICAL DATA:  Respiratory failure.  History of right lung cancer. EXAM: PORTABLE CHEST 1 VIEW COMPARISON:  Chest and CT chest 11/30/2016 FINDINGS: Persistent interstitial infiltration in the right lower lung with right hilar and infrahilar mass lesion again demonstrated. Changes are consistent with lung cancer and lymphangitic tumor spread. Emphysematous changes in the lungs. Normal heart size. Tortuous aorta. Degenerative changes in the shoulders. IMPRESSION: Right hilar and infrahilar mass with associated interstitial lymphangitic changes in the right lower lung. No significant changes since previous study.  Electronically Signed   By: Lucienne Capers M.D.   On: 11/29/2016 02:44   Dg Chest Portable 1 View  Result Date: 11/20/2016 CLINICAL DATA:  Worsening shortness of breath and difficulty breathing today. History of COPD. Stage IV lung cancer. EXAM: PORTABLE CHEST 1 VIEW COMPARISON:  11/19/2016.  05/06/2016. FINDINGS: Advanced pattern of widespread emphysema. Extensive tumor in the right lower lobe and hilar region with lymphangitic infiltration throughout  most of the right lung. This appears quite similar allowing for technical differences. There could be coexistent pulmonary inflammation. Fibrotic pattern at the left base as seen previously. IMPRESSION: No radiographic change since 11/19/2016. Advanced tumor in the right lower lobe and hilar region with lymphangitic infiltration of the right lung. Advanced background emphysema. There could be coexistent inflammatory change, but that is not specifically delineated. Electronically Signed   By: Nelson Chimes M.D.   On: 11/24/2016 17:53   Dg Chest Portable 1 View  Result Date: 11/19/2016 CLINICAL DATA:  Cough and shortness of breath for 2 weeks.  COPD. EXAM: PORTABLE CHEST 1 VIEW COMPARISON:  05/06/2016 FINDINGS: Right infrahilar mass like appearance with a density in this region measuring about 6.0 by 4.8 cm. Surrounding airspace opacities and coarse interstitial opacities primarily in the right lower lobe and right middle lobe. Faint interstitial accentuation at the left lung base. Severe emphysema. Tortuous thoracic aorta. Severe degenerative glenohumeral arthropathy bilaterally. IMPRESSION: 1. Right infrahilar masslike appearance suspicious for malignancy. Conceivably this could be due to more focal consolidation in the setting of background coarse interstitial accentuation and airspace opacities in the right lower lobe and right middle lobe, rather than necessarily being from malignancy. Chest CT (with contrast if feasible) is recommended for further characterization. 2. Severe emphysema. 3. Tortuous thoracic aorta. 4. Severe degenerative glenohumeral arthropathy bilaterally. Electronically Signed   By: Van Clines M.D.   On: 11/19/2016 10:45     Assessment and plan- Patient is a 76 y.o. 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 Patient does not have decision making capacity and only wishes to remain comfortable. His performance status is poor and palliative chemotherapy is not an option at this time. Patients daughter would not like to put him through intubation and CPR and wishes to keep him comfortable. I appreciate palliative care input in assisting with this transition to comfort measures at this time. If patients condition stabilizes home hospice would be a potential option.    Visit Diagnosis 1. COPD exacerbation ([place])   2. HCAP (healthcare-associated pneumonia)   3. Acute on chronic respiratory failure with hypoxia (HCC)   4. Anemia, unspecified type   5. Respiratory failure ([place])      Dr. Randa Evens, MD, MPH [facility] at [facility] Pager- [phone] 11/29/2016 12:59 PM

## 2016-11-29 NOTE — Care Management (Signed)
Transferred to 1C from icu for comfort care.

## 2016-11-29 NOTE — Progress Notes (Signed)
PULMONARY / CRITICAL CARE MEDICINE   Name: Jonathan Howell MRN:   [id number] DOB:   04-23-1941           ADMISSION DATE:  12/05/2016 CONSULTATION DATE:  11/28/2016  REFERRING MD:  Dr. Brenton Grills  CHIEF COMPLAINT: Shortness of breath  HISTORY OF PRESENT ILLNESS:   Jonathan Howell is 76 year old male with past medical history significant for COPD, Tobacco Abuse, emphysema.  Patient was recently diagnosed with stage IV- Adenocarcinoma of the lung. Patient was recently admitted from 2/6 -2/8 with COPD exacerbation and Pneumonia.Patient presents to [facility] on  2/14 with worsening shortness of breath requiring BiPAP.PCCM team was called to admit the patient.  SUBJECTIVE:  Pt states he is comfortable on nonrebreather mask, he denies pain.  VITAL SIGNS: BP (!) 138/97   Pulse (!) 122   Temp 98.5 F (36.9 C) (Oral)   Resp (!) 21   Ht '5\' 2"'$  (1.575 m)   Wt 61.7 kg (136 lb)   SpO2 100%   BMI 24.87 kg/m   HEMODYNAMICS:  INTAKE / OUTPUT: No intake/output data recorded.  PHYSICAL EXAMINATION: General:  Pleasant gentleman on nonrebreather mask Neuro:  Awake, alert, oriented, no focal deficits HEENT:  AT,[place],PERRLA,No jvd Cardiovascular: S1S2,Regular, No MRG Lungs: diminished throughout, even, non labored  Abdomen:  Distended, soft, non tender, hypoactive BS x4 Musculoskeletal:  Active ROM, no deformity noted, no edema  Skin:  Warm, dry and intact  LABS: Reviewed   STUDIES:  2/14 CTPE>>Slight increase in small right pleural effusion with furtherthickening coarsening of interstitium suggestive of lymphangiticspread of tumor. The primary mass in the right lower lobe is not significantly changed however masslike lymphadenopathy about thehila are slightly increased in size since prior.. Some progression of postobstructive pulmonary consolidation and pneumonia since prior exam to the right middle and lower lobes.. Metastatic subcentimeter pulmonary nodules bilaterally  withoutsignificant change. COPD.  11/19/16 CT thorax>>1. 5 cm right lower lobe lung mass with confluent right hilar and infrahilar tumor and ipsilateral and contralateral mediastinal lymphadenopathy. Obstructive pneumonia and probable interstitial spread of tumor. Pulmonary metastatic disease.Marland Kitchen No obvious upper abdominal metastatic disease or osseousmetastatic disease. Severe underlying emphysema. Bilateral adrenal gland nodules, unchanged since 2016 and likely adenomas  CULTURES: 2/14 BC>>None 2/14 UC>>  ANTIBIOTICS: 2/14 Vancomycin>>02/16 2/14 Zosyn>>02/16  SIGNIFICANT EVENTS: 2/14 Patient admitted to the ICU  On BiPAP with COPD Exacerbation and adenocarcinoma of the right lung. 2/16 Pt comfort care measures only   LINES/TUBES: None  ASSESSMENT / PLAN: Pt is full comfort care attempting to decrease O2 to 6L via nasal canula in an attempt to send pt home with hospice.  However, if unable to decrease O2 will transfer pt to oncology unit today 02/16.    Marda Stalker, [place] Pager [phone] (please enter 7 digits) PCCM Consult Pager [phone] (please enter 7 digits)

## 2016-11-29 NOTE — Consult Note (Signed)
Consultation Note Date: 11/29/2016   Patient Name: Jonathan Howell  DOB: 1941-07-14  MRN: [id number]  Age / Sex: 76 y.o., male  PCP: Petra Kuba, MD Referring Physician: Wilhelmina Mcardle, MD  Reason for Consultation: Establishing goals of care  HPI/Patient Profile: 76 y.o. male  with past medical history of hypertension, COPD, and emphysema admitted on 11/25/2016 with shortness of breath. 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 Followed by Dr. Janese Banks outpatient. Admitted on 2/6 to 2/8 with COPD exacerbation and pneumonia. In ED, patient required BiPAP. Started on antibiotics, nebs, and IV steroids. Post obstructive pneumonia likely due to tumor progression. Patient unable to wean from NRB mask due to desaturation. He is not a candidate for palliative chemotherapy with end stage COPD. Palliative medicine consultation for goals of care.      Clinical Assessment and Goals of Care: I have reviewed medical records, discussed with Dr. Janese Banks and met with patient, daughter Sharilyn Sites), and grandson at bedside to discuss diagnosis, prognosis, GOC, EOL wishes, disposition and options.  Introduced Palliative Medicine as specialized medical care for people living with serious illness. It focuses on providing relief from the symptoms and stress of a serious illness. The goal is to improve quality of life for both the patient and the family.  Patient lives at home with daughter and grandson. Prior to hospitalizations and recent diagnosis with cancer, he was fairly independent, able to perform ADL's, and with good appetite. He has greatly declined in two weeks, with dyspnea at rest and decreased appetite.  Discussed diagnoses and natural trajectory of COPD that is contributing to decline. Daughter understands he is not a candidate for palliative chemotherapy with  the poor condition of his lungs and with minimal response to aggressive medical interventions (nebs, steroids, antibiotics).    Discussed advanced directives, concepts specific to code status, and artifical feeding and hydration. [name] last night. She tells me she would not want CPR/intubation, knowing this would not change the cancer or his chronic, progressive disease.    [name] tells me she just "wants him to be comfortable and not suffer." Discussed transition to focus on comfort measures only and no escalation of care. Comfort, quality, and dignity for as much time as he has left. Discussed focus on symptom management for shortness of breath/air hunger instead of escalating with NRB/BiPAP. [name] would like for him to eat and drink. Diet order placed.   Educated on hospice options, home versus hospice home. [name] is leaning towards hospice home if he is stable for transfer from hospital after transitioned from NRB to nasal cannula.   Questions and concerns were addressed.  Hard Choices booklet left for review.   SUMMARY OF RECOMMENDATIONS    DNR/DNI. Durable DNR placed in chart.   Comfort measures only. Discontinued medications, labs, cardiac monitor, and all interventions not aimed at comfort.   Patient wants to eat. Diet order placed.   Discussed with RN. Give Roxanol now  and transition to 6L nasal cannula. Treat dyspnea/air hunger with Roxanol.   Discussed hospice services. Daughter will likely want hospice home if stable for discharge.   PMT not at [facility] over the weekend but will follow-up Monday, 2/19 if patient still hospitalized.   Code Status/Advance Care Planning:  DNR   Symptom Management:   Roxanol '5mg'$  PO scheduled   Roxanol '5mg'$  SL q1h prn pain/dyspnea/air hunger  Ativan 0.'5mg'$  PO q4h prn anxiety  Robinul 0.'2mg'$  IV q4h prn secretions  Palliative Prophylaxis:   Aspiration, Delirium Protocol, Frequent Pain Assessment  and Oral Care  Additional Recommendations (Limitations, Scope, Preferences):  Full Comfort Care  Psycho-social/Spiritual:   Desire for further Chaplaincy support:no  Additional Recommendations: Caregiving  Support/Resources and Education on Hospice  Prognosis:   < 2 weeks Lung cancer with mets to bone, end-stage COPD, and obstructive pneumonia.   Discharge Planning: To Be Determined likely hospice home if he remains stable after transition from NRB to nasal cannula     Primary Diagnoses: Present on Admission: . COPD exacerbation ([place])   I have reviewed the medical record, interviewed the patient and family, and examined the patient. The following aspects are pertinent.  Past Medical History:  Diagnosis Date  . Asthma   . COPD (chronic obstructive pulmonary disease) ([place])   . Emphysema of lung ([place])   . Hypertension   . Personal history of tobacco use, presenting hazards to health 07/14/2015   Social History   Social History  . Marital status: Widowed    Spouse name: N/A  . Number of children: N/A  . Years of education: N/A   Social History Main Topics  . Smoking status: Former Smoker    Packs/day: 0.25    Years: 60.00    Quit date: 11/18/2016  . Smokeless tobacco: Never Used     Comment: 1 pack per day  . Alcohol use No     Comment: none x 30 yr per daughter kim  . Drug use: No  . Sexual activity: Yes    Birth control/ protection: None   Other Topics Concern  . None   Social History Narrative   Lives with family. Independent at baseline.   Family History  Problem Relation Age of Onset  . Breast cancer Mother   . Suicidality Father   . Psychiatric Illness Sister    Scheduled Meds: . budesonide (PULMICORT) nebulizer solution  0.25 mg Nebulization Q6H  . ipratropium-albuterol  3 mL Nebulization Q6H  . mouth rinse  15 mL Mouth Rinse BID  . morphine CONCENTRATE  5 mg Oral Q4H  . traZODone  150 mg Oral QHS   Continuous Infusions: PRN  Meds:.acetaminophen, albuterol, docusate sodium, glycopyrrolate, LORazepam, morphine injection, morphine CONCENTRATE Medications Prior to Admission:  Prior to Admission medications   Medication Sig Start Date End Date Taking? Authorizing Provider  amoxicillin-clavulanate (AUGMENTIN) 875-125 MG tablet Take 1 tablet by mouth every 12 (twelve) hours. 11/21/16  Yes Fritzi Mandes, MD  folic acid (FOLVITE) 1 MG tablet Take 1 tablet (1 mg total) by mouth daily. 11/26/16  Yes Sindy Guadeloupe, MD  predniSONE (DELTASONE) 10 MG tablet Take 5 tablets (50 mg total) by mouth daily with breakfast. Start 50 mg daily taper by 10 mg daily then stop 11/22/16  Yes Fritzi Mandes, MD  QUEtiapine (SEROQUEL) 300 MG tablet Take 300 mg by mouth at bedtime.   Yes Historical Provider, MD  docusate sodium (COLACE) 100 MG capsule Take 1 capsule (100 mg total) by mouth 2 (  two) times daily. Patient not taking: Reported on 11/26/2016 05/16/16   Hollice Espy, MD  levalbuterol [facility]) 1.25 MG/0.5ML nebulizer solution Take 1.25 mg by nebulization every 6 (six) hours. Patient not taking: Reported on 11/26/2016 11/21/16   Fritzi Mandes, MD  mometasone-formoterol [facility]) 200-5 MCG/ACT AERO Inhale 2 puffs into the lungs 2 (two) times daily. Patient not taking: Reported on 11/26/2016 11/21/16   Fritzi Mandes, MD   No Known Allergies Review of Systems  Unable to perform ROS: Mental status change    Physical Exam  Constitutional: He is cooperative.  HENT:  Head: Normocephalic and atraumatic.  Cardiovascular: Tachycardia present.   Pulmonary/Chest: He has decreased breath sounds. He has wheezes.  Dyspnea at rest  Abdominal: Normal appearance.  Neurological: He is alert.  Oriented to person and place. Pleasantly confused.  Skin: Skin is warm and dry.  Psychiatric: He has a normal mood and affect. His speech is normal and behavior is normal.  Nursing note and vitals reviewed.  Vital Signs: BP (!) 151/88   Pulse 77   Temp 97.1 F (36.2 C)  (Axillary)   Resp 16   Ht '5\' 2"'$  (1.575 m)   Wt 65.3 kg (143 lb 15.4 oz)   SpO2 91%   BMI 26.33 kg/m  Pain Assessment: No/denies pain   Pain Score: 0-No pain   SpO2: SpO2: 91 % O2 Device:SpO2: 91 % O2 Flow Rate: .O2 Flow Rate (L/min): 10 L/min  IO: Intake/output summary:   Intake/Output Summary (Last 24 hours) at 11/29/16 1013 Last data filed at 11/29/16 0602  Gross per 24 hour  Intake              450 ml  Output             1400 ml  Net             -950 ml    LBM: Last BM Date: 11/24/2016 Baseline Weight: Weight: 61.7 kg (136 lb) Most recent weight: Weight: 65.3 kg (143 lb 15.4 oz)     Palliative Assessment/Data: PPS 30%   Flowsheet Rows   Flowsheet Row Most Recent Value  Intake Tab  Referral Department  Critical care  Unit at Time of Referral  ICU  Palliative Care Primary Diagnosis  Cancer  Date Notified  11/28/16  Palliative Care Type  Return patient Palliative Care  Reason for referral  Clarify Goals of Care  Date of Admission  11/30/2016  Date first seen by Palliative Care  11/28/16  # of days Palliative referral response time  0 Day(s)  # of days IP prior to Palliative referral  1  Clinical Assessment  Palliative Performance Scale Score  30%  Psychosocial & Spiritual Assessment  Palliative Care Outcomes  Patient/Family meeting held?  Yes  Who was at the meeting?  patient, daughter, and grandson  Palliative Care Outcomes  Clarified goals of care, Counseled regarding hospice, Changed to focus on comfort, Provided psychosocial or spiritual support, Completed durable DNR, Changed CPR status, Provided end of life care assistance, Improved non-pain symptom therapy, Improved pain interventions  Patient/Family wishes: Interventions discontinued/not started   BiPAP, Transfer out of ICU, Antibiotics     Time In: 0745 Time Out: 0855 Time Total: 24mn Greater than 50%  of this time was spent counseling and coordinating care related to the above assessment and  plan.  Signed by:  MIhor Dow FNP-C Palliative Medicine Team  Phone: [phone]: [phone]  Please contact Palliative Medicine Team phone at [phone]  for questions and concerns.  For individual provider: See Shea Evans

## 2016-11-30 MED ORDER — TIOTROPIUM BROMIDE MONOHYDRATE 18 MCG IN CAPS
18.0000 ug | ORAL_CAPSULE | Freq: Every day | RESPIRATORY_TRACT | Status: DC
  Administered 2016-11-30: 18 ug via RESPIRATORY_TRACT
  Filled 2016-11-30: qty 5

## 2016-11-30 MED ORDER — FLUTICASONE FUROATE-VILANTEROL 100-25 MCG/INH IN AEPB
1.0000 | INHALATION_SPRAY | Freq: Every day | RESPIRATORY_TRACT | Status: DC
  Administered 2016-11-30: 1 via RESPIRATORY_TRACT
  Filled 2016-11-30: qty 28

## 2016-11-30 MED ORDER — PREDNISONE 20 MG PO TABS
40.0000 mg | ORAL_TABLET | Freq: Every day | ORAL | Status: DC
  Administered 2016-11-30: 17:00:00 40 mg via ORAL
  Filled 2016-11-30 (×2): qty 2

## 2016-11-30 NOTE — Progress Notes (Signed)
PULMONARY / CRITICAL CARE MEDICINE   Name: Jonathan Howell MRN:   [id number] DOB:   1941/01/02           ADMISSION DATE:  11/30/2016 CONSULTATION DATE:  11/21/2016  REFERRING MD:  Dr. Brenton Grills  CHIEF COMPLAINT: Shortness of breath  HISTORY OF PRESENT ILLNESS:   Jonathan Howell is 76 year old male with past medical history significant for COPD, Tobacco Abuse, emphysema.  Patient was recently diagnosed with stage IV- Adenocarcinoma of the lung. Patient was recently admitted from 2/6 -2/8 with COPD exacerbation and Pneumonia.Patient presents to [facility] on  2/14 with worsening shortness of breath requiring BiPAP.PCCM team was called to admit the patient.  SUBJECTIVE:  States over last 24 hours is doing ok. Dyspnea with any little activity  VITAL SIGNS:Stable hemodynamics  PHYSICAL EXAMINATION: General:  Pleasant gentleman on nonrebreather mask Neuro:  Awake, alert, oriented, no focal deficits HEENT:  AT,[place],PERRLA,No jvd Cardiovascular: S1S2,Regular, No MRG Lungs: diminished throughout, even, non labored  Abdomen:  Distended, soft, non tender, hypoactive BS x4 Musculoskeletal:  Active ROM, no deformity noted, no edema  Skin:  Warm, dry and intact  LABS: Reviewed   STUDIES:  2/14 CTPE>>Slight increase in small right pleural effusion with furtherthickening coarsening of interstitium suggestive of lymphangiticspread of tumor. The primary mass in the right lower lobe is not significantly changed however masslike lymphadenopathy about thehila are slightly increased in size since prior.. Some progression of postobstructive pulmonary consolidation and pneumonia since prior exam to the right middle and lower lobes.. Metastatic subcentimeter pulmonary nodules bilaterally withoutsignificant change. COPD.  11/19/16 CT thorax>>1. 5 cm right lower lobe lung mass with confluent right hilar and infrahilar tumor and ipsilateral and contralateral mediastinal lymphadenopathy. Obstructive  pneumonia and probable interstitial spread of tumor. Pulmonary metastatic disease.Marland Kitchen No obvious upper abdominal metastatic disease or osseousmetastatic disease. Severe underlying emphysema. Bilateral adrenal gland nodules, unchanged since 2016 and likely adenomas  CULTURES: 2/14 BC>>None 2/14 UC>>  ANTIBIOTICS: 2/14 Vancomycin>>02/16 2/14 Zosyn>>02/16  SIGNIFICANT EVENTS: 2/14 Patient admitted to the ICU  On BiPAP with COPD Exacerbation and adenocarcinoma of the right lung. 2/16 Pt comfort care measures only   LINES/TUBES: None  ASSESSMENT / PLAN: Pt is full comfort care attempting to decrease O2 to 6L via nasal canula in an attempt to send pt home with hospice.  Denies any pain at this time. On 6L [place]. Will transfer to medicine team.  Pending hospice.   Hermelinda Dellen, DO

## 2016-12-02 LAB — CULTURE, BLOOD (ROUTINE X 2)
Culture: NO GROWTH
Culture: NO GROWTH

## 2016-12-02 NOTE — Progress Notes (Signed)
Pt expired at 23:46 pm. Family was present. Chaplin services offered, family declined. MD notified. AC notified. [employer] notified.

## 2016-12-05 LAB — SURGICAL PATHOLOGY

## 2016-12-06 ENCOUNTER — Other Ambulatory Visit: Payer: Medicare Other

## 2016-12-06 ENCOUNTER — Ambulatory Visit: Payer: Medicare Other | Admitting: Oncology

## 2016-12-06 ENCOUNTER — Encounter: Payer: Self-pay | Admitting: Oncology

## 2016-12-09 ENCOUNTER — Encounter: Payer: Self-pay | Admitting: Oncology

## 2016-12-12 NOTE — Progress Notes (Signed)
Spoke with Mariann Laster at [facility] [phone]; advised Mariann Laster of pt's decline throughout the last shift; pt and his daughter verbalized that they do not want to go to the [facility]; they either want to go home with hospice (if pt is physically able) or stay here in the hospital; Mariann Laster to pass this on

## 2016-12-12 NOTE — Progress Notes (Signed)
[place] at [place] NAME: Jonathan Howell    MR#:  [id number]  DATE OF BIRTH:  28-Jan-1941  SUBJECTIVE:  CHIEF COMPLAINT:   Chief Complaint  Patient presents with  . Shortness of Breath    History of COPD and smoking with emphysema, recently found with stage IV adenocarcinoma of the lung, came with respiratory distress and requiring BiPAP, admitted to ICU initially now family agreed on comfort measures only so he was transferred to floor with supplemental oxygen When I saw he is drowsy and having shallow breathing with, but opens eyes to voice stimuli. No family member was present in the room. REVIEW OF SYSTEMS:   Patient is drowsy and not able to provide review of systems to me. ROS  DRUG ALLERGIES:  No Known Allergies  VITALS:  Blood pressure (!) 126/111, pulse (!) 126, temperature 97.4 F (36.3 C), temperature source Oral, resp. rate 17, height '5\' 4"'$  (1.626 m), weight 61 kg (134 lb 6.4 oz), SpO2 (!) 65 %.  PHYSICAL EXAMINATION:  GENERAL:  76 y.o.-year-old patient lying in the bed with Drowsiness and Agonal breathing.  EYES: Pupils equal, round, reactive to light. No scleral icterus. Extraocular muscles intact.  HEENT: Head atraumatic, normocephalic. Oropharynx and nasopharynx clear.  NECK:  Supple, no jugular venous distention. No thyroid enlargement, no tenderness.  LUNGS: Normal breath sounds bilaterally, no wheezing, rales,rhonchi or crepitation. No use of accessory muscles of respiration.  CARDIOVASCULAR: S1, S2 normal. No murmurs, rubs, or gallops.  ABDOMEN: Soft, nontender, nondistended. Bowel sounds present. No organomegaly or mass.  EXTREMITIES: No pedal edema, lower extremities cyanosis and clubbing.  NEUROLOGIC: Patient is drowsy, opens eyes to stimuli but he is appears active and of his life as he has agonal breathing.  PSYCHIATRIC: The patient drowsy and agonal breathing.  SKIN: No obvious rash, lesion, or ulcer.    Physical Exam LABORATORY PANEL:   CBC  Recent Labs Lab 11/29/16 0611  WBC 14.1*  HGB 7.1*  HCT 22.0*  PLT 162   ------------------------------------------------------------------------------------------------------------------  Chemistries   Recent Labs Lab 11/18/2016 1729 11/28/16 0535 11/29/16 0720  NA 141 143 140  K 3.9 3.9 3.6  CL 109 113* 110  CO2 21* 20* 23  GLUCOSE 98 122* 105*  BUN 43* 37* 39*  CREATININE 1.42* 1.35* 1.42*  CALCIUM 8.4* 7.8* 7.9*  MG  --  2.5*  --   AST 55*  --   --   ALT 26  --   --   ALKPHOS 87  --   --   BILITOT 0.3  --   --    ------------------------------------------------------------------------------------------------------------------  Cardiac Enzymes  Recent Labs Lab 11/29/2016 1729  TROPONINI 0.03*   ------------------------------------------------------------------------------------------------------------------  RADIOLOGY:  No results found.  ASSESSMENT AND PLAN:   Active Problems:   COPD exacerbation (HCC)   Acute on chronic respiratory failure with hypoxia (HCC)   Adenocarcinoma of right lung ([place])   HCAP (healthcare-associated pneumonia)   Palliative care by specialist   DNR (do not resuscitate)   Comfort measures only status   * Acute on chronic respiratory failure with hypoxia   COPD exacerbation   Adenocarcinoma stage IV   Severe emphysema    Patient is comfort care currently continue supplemental oxygen and comfort measures.   We will involve hospitalist team tomorrow.     All the records are reviewed and case discussed with Care Management/Social Workerr. Management plans discussed with the patient, family and they are in agreement.  CODE STATUS: DNR  TOTAL TIME TAKING CARE OF THIS PATIENT: 20 minutes.     POSSIBLE D/C IN 1-2 DAYS, DEPENDING ON CLINICAL CONDITION.   Vaughan Basta M.D on 12-06-16   Between 7am to 6pm - Pager - [phone]  After 6pm go to [email or web address] -  password [email or web address] [facility]  Office  [phone]  CC: Primary care physician; Petra Kuba, MD  Note: This dictation was prepared with Dragon dictation along with smaller phrase technology. Any transcriptional errors that result from this process are unintentional.

## 2016-12-12 NOTE — Discharge Summary (Signed)
  Death report/ hospital course.  Date of death- 12/06/16  Cause of death   Adenocarcinoma of right lung    COPD   Ac on ch respi failure  Hospital course    History of COPD and smoking with emphysema, recently found with stage IV adenocarcinoma of the lung, came with respiratory distress and requiring BiPAP, admitted to ICU initially later family agreed on comfort measures only so he was transferred to floor with supplemental oxygen When I saw earlier in day, he is drowsy and having shallow breathing with, but opens eyes to voice stimuli.   Later that night- he passed away in presence of family members.

## 2016-12-12 DEATH — deceased

## 2018-12-07 IMAGING — MR MR HEAD WO/W CM
13 series · 46 of 48 positions shown · IV contrast (multihance)
Comparison: Brain MRI without contrast 05/08/2016

CLINICAL DATA: 75-year-old male with increasing cough and shortness
of Breath. Chest imaging shows evidence of right lung mass, hilar
tumor, and pulmonary metastatic disease.

EXAM:
MRI HEAD WITHOUT AND WITH CONTRAST
TECHNIQUE: Multiplanar, multiecho pulse sequences of the brain and surrounding
structures were obtained without and with intravenous contrast.
CONTRAST:  11mL MULTIHANCE GADOBENATE DIMEGLUMINE 529 MG/ML IV SOLN

[Series 2: T1 · sagittal · 5.0mm · 0.45mm/px · 3 of 25 slices shown (1 of 2)]
[im 1/25]
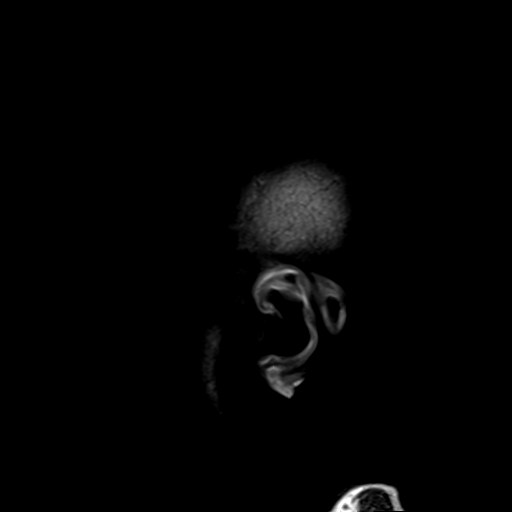
[im 13/25]
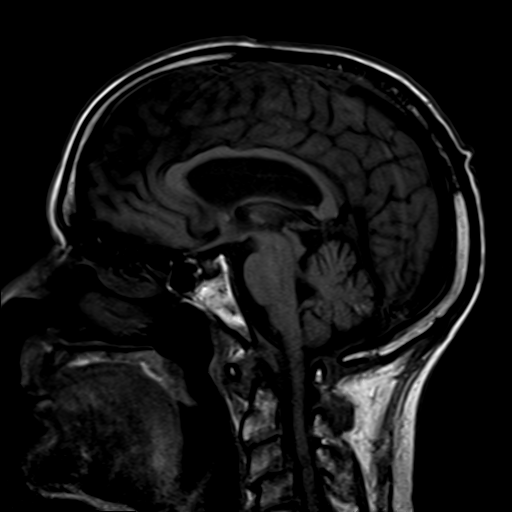
[im 25/25]
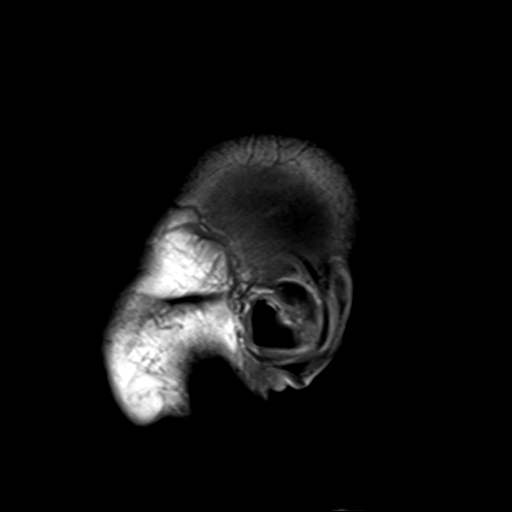

[Series 4: DWI · axial · 3.0mm · 1.80mm/px · z∈[-58,+100]mm · 6 of 50 slices shown (1 of 4)]
[im 1/50]
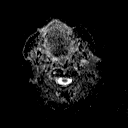
[im 10/50]
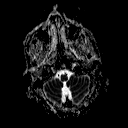
[im 20/50]
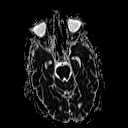
[im 30/50]
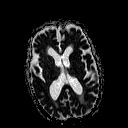
[im 40/50]
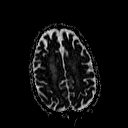
[im 50/50]
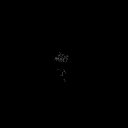

[Series 6: DWI · coronal · 3.0mm · 1.80mm/px · 4 of 45 slices shown (2 of 4)]
[im 1/45]
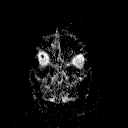
[im 15/45]
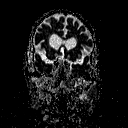
[im 30/45]
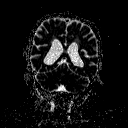
[im 45/45]
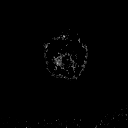

[Series 7: T2 · axial · 5.0mm · 0.60mm/px · z∈[-56,+100]mm · 2 of 25 slices shown (1 of 2)]
[im 1/25]
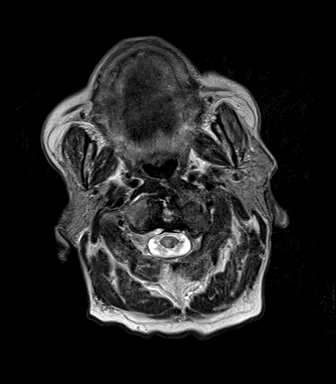
[im 25/25]
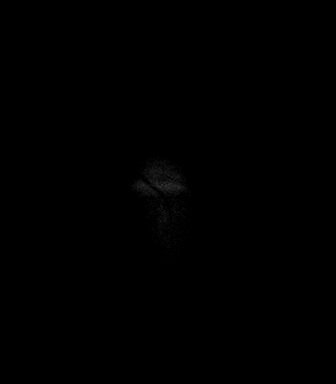

[Series 8: FLAIR · axial · 5.0mm · 0.45mm/px · z∈[-56,+100]mm · 2 of 25 slices shown]
[im 1/25]
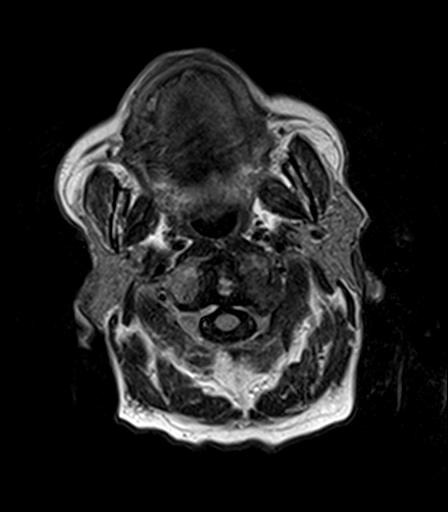
[im 25/25]
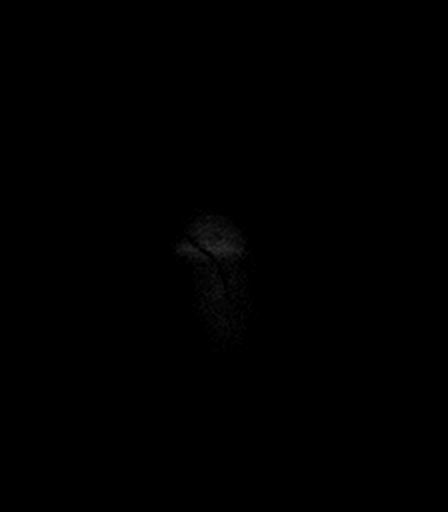

[Series 9: T2 · axial · 5.0mm · 0.45mm/px · z∈[-56,+100]mm · 2 of 25 slices shown (2 of 2)]
[im 1/25]
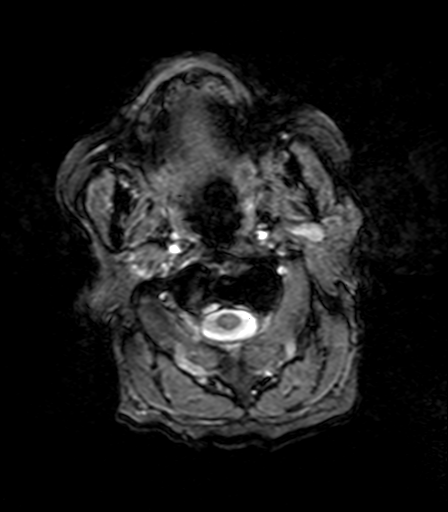
[im 25/25]
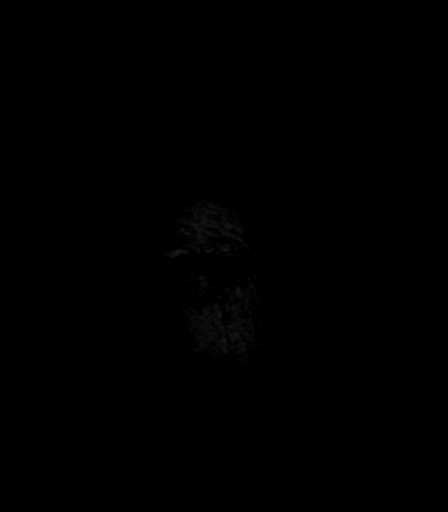

[Series 10: T1 · axial · 3.0mm · 1.00mm/px · z∈[-66,+39]mm · 4 of 60 slices shown (2 of 2)]
[im 1/60]
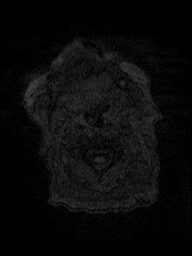
[im 12/60]
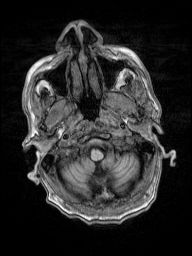
[im 24/60]
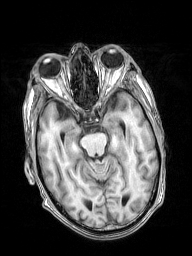
[im 36/60]
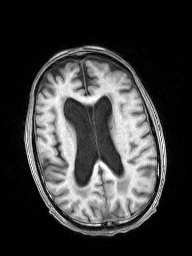

[Series 11: T2 post-contrast · coronal · 5.0mm · 0.49mm/px · 3 of 29 slices shown]
[im 1/29]
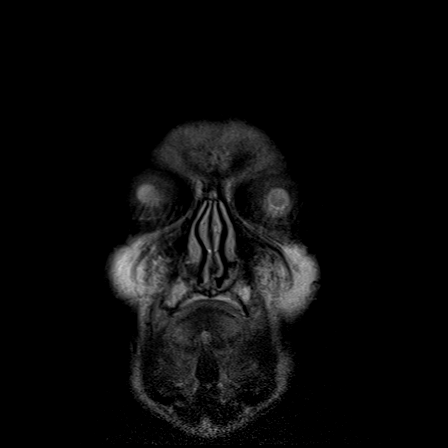
[im 15/29]
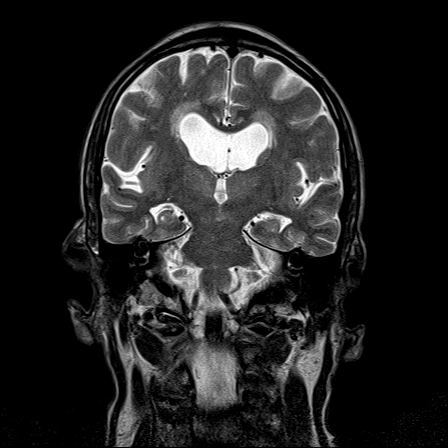
[im 29/29]
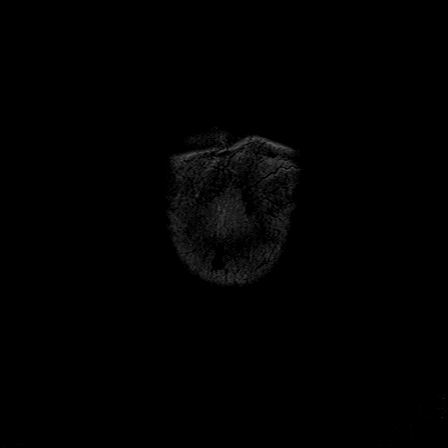

[Series 12: T1 post-contrast · axial · 3.0mm · 1.00mm/px · z∈[-66,+110]mm · 6 of 60 slices shown (1 of 3)]
[im 1/60]
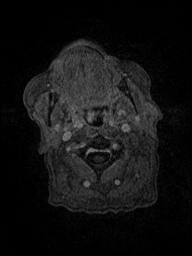
[im 12/60]
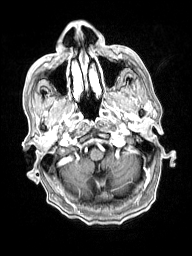
[im 24/60]
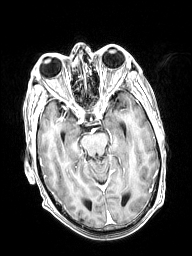
[im 36/60]
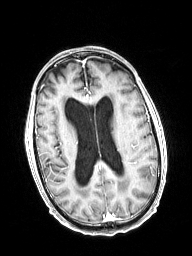
[im 48/60]
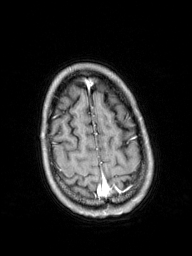
[im 60/60]
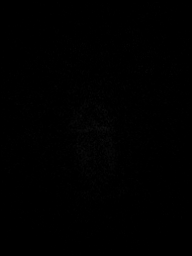

[Series 13: T1 post-contrast · sagittal · 5.0mm · 0.45mm/px · 2 of 25 slices shown (2 of 3)]
[im 1/25]
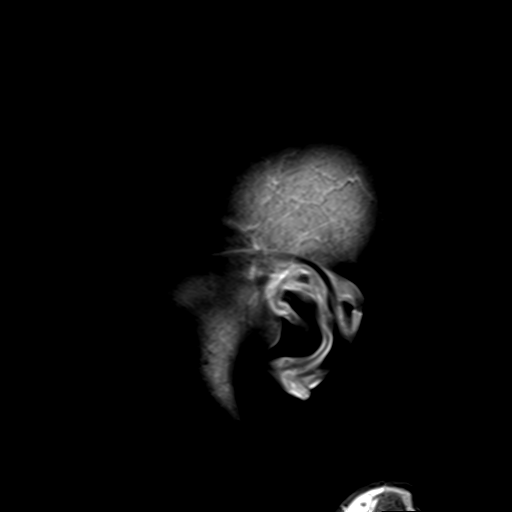
[im 25/25]
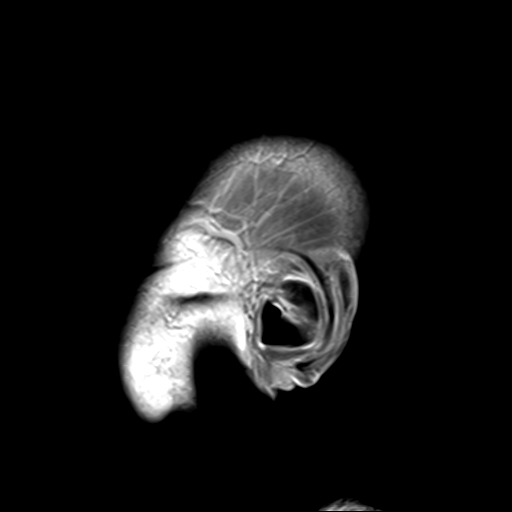

[Series 14: T1 post-contrast · coronal · 5.0mm · 0.43mm/px · 3 of 29 slices shown (3 of 3)]
[im 1/29]
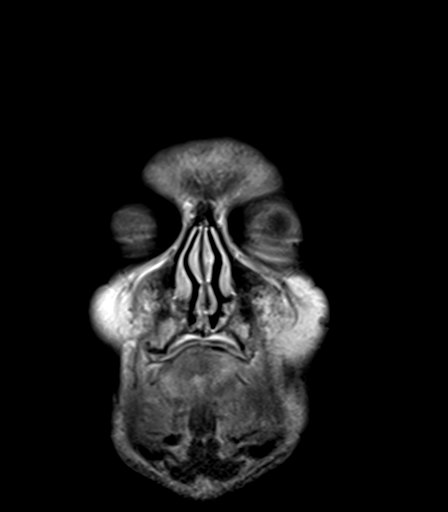
[im 15/29]
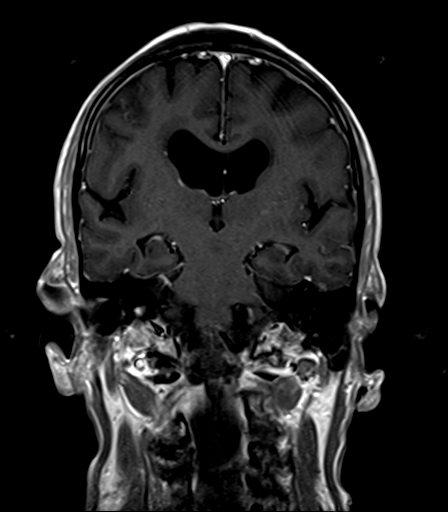
[im 29/29]
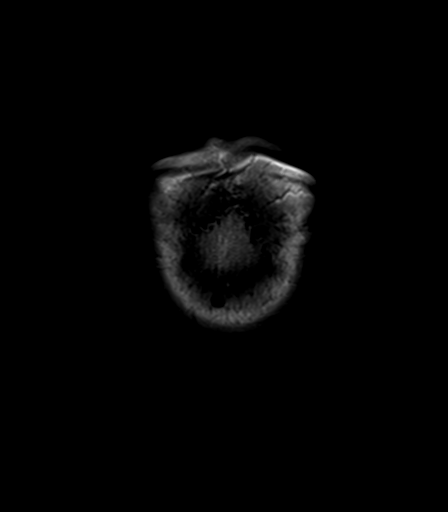

[Series 100: DWI · axial · 3.0mm · 1.80mm/px · z∈[-58,+103]mm · 5 of 54 slices shown (3 of 4)]
[im 1/54]
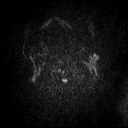
[im 14/54]
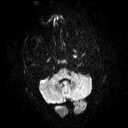
[im 27/54]
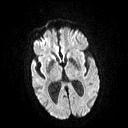
[im 40/54]
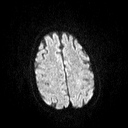
[im 54/54]
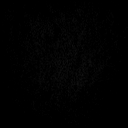

[Series 101: DWI · coronal · 3.0mm · 1.80mm/px · 4 of 45 slices shown (4 of 4)]
[im 1/45]
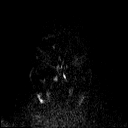
[im 15/45]
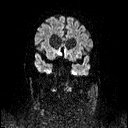
[im 30/45]
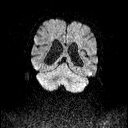
[im 45/45]
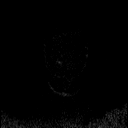

[46 of 48 positions shown; findings below may reference images not displayed]

FINDINGS: Brain: No abnormal or suspicious enhancement of the brain on axial
or coronal postcontrast images. However, on sagittal post-contrast
images (series 13 images 11 and 17) there are 2 small 1-2 mm foci of
nodular enhancement. I favor these are vascular related rather than
very small brain metastases. There is pulsation artifact in the
posterior fossa on sagittal and coronal post-contrast images. No
dural thickening.

No restricted diffusion to suggest acute infarction. No midline
shift, mass effect, evidence of mass lesion, ventriculomegaly,
extra-axial collection or acute intracranial hemorrhage. Pituitary
within normal limits. Nonspecific cerebral white matter T2 and FLAIR
hyperintensity is mild to moderate for age. No cortical
encephalomalacia or chronic cerebral blood products. There are
several tiny chronic lacunar infarcts in the cerebellar hemispheres,
more so the left.

Vascular: Major intracranial vascular flow voids are stable and
within normal limits.

Skull and upper cervical spine: Advanced degenerative changes in the
cervical spine including ligamentous hypertrophy about the odontoid
and multilevel spondylolisthesis. There is some associated
degenerative cervicomedullary junction and spinal stenosis but
otherwise negative visualized cervical spinal cord.

Visualized bone marrow signal is within normal limits.

Sinuses/Orbits: Negative orbits soft tissues.

Right frontal and ethmoid sinus mucosal thickening and bubbly
opacity.

Other Visualized paranasal sinuses and mastoids are well
pneumatized.

Other: Visible internal auditory structures appear normal. Negative
scalp soft tissues.
IMPRESSION: 1. Difficult to exclude two very early brain metastases in both
superior frontal lobes, but favor instead benign vascular related
enhancement.
Repeat Brain MRI without and with contrast recommended in 6-8 weeks
(ideally an SRS protocol study performed on a 3 Tesla MRI unit).
2. Otherwise no metastatic disease or acute intracranial
abnormality.
3. Advanced cervical spine degeneration.

## 2018-12-08 IMAGING — US US BIOPSY
1 series · 9 of 9 positions shown · non-contrast
Comparison: none

CLINICAL DATA: Right lower lobe lung mass with metastatic
lymphadenopathy to the mediastinum, right hilum and right
supraclavicular region by prior imaging.

[Series 1: us biopsy · 0.05mm/px · 9 of 9 slices shown]
[im 1/9]
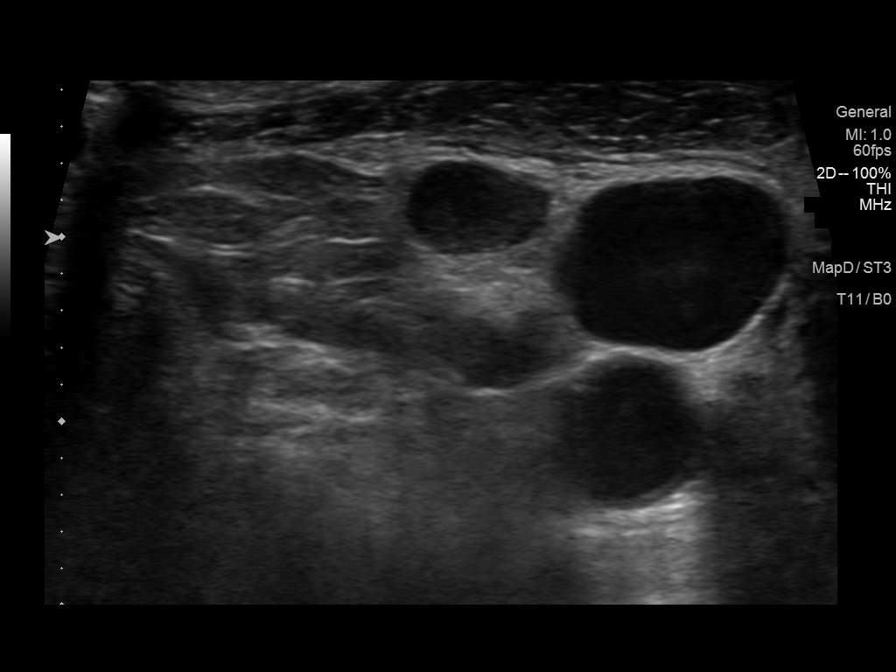
[im 2/9]
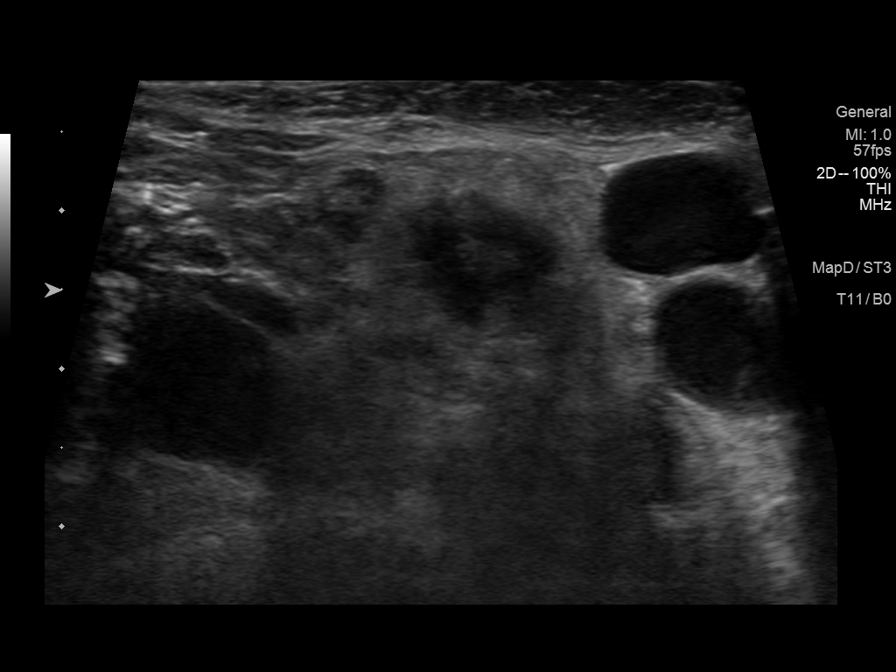
[im 3/9]
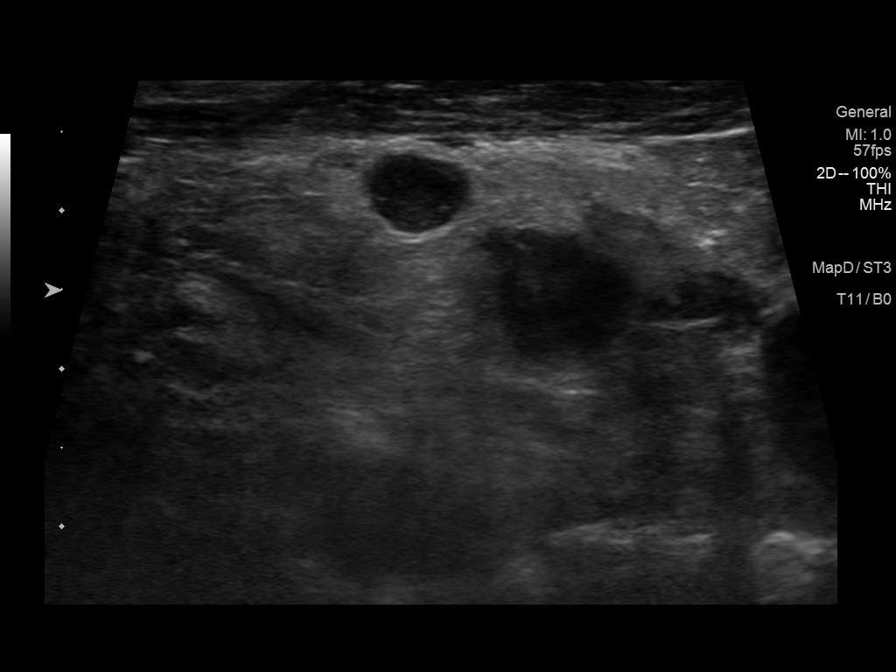
[im 4/9]
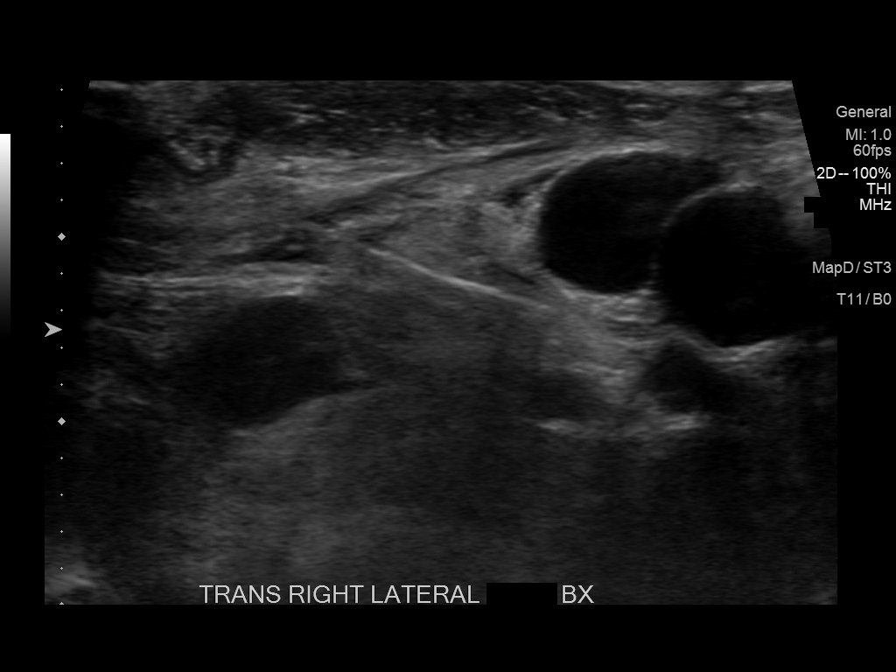
[im 5/9]
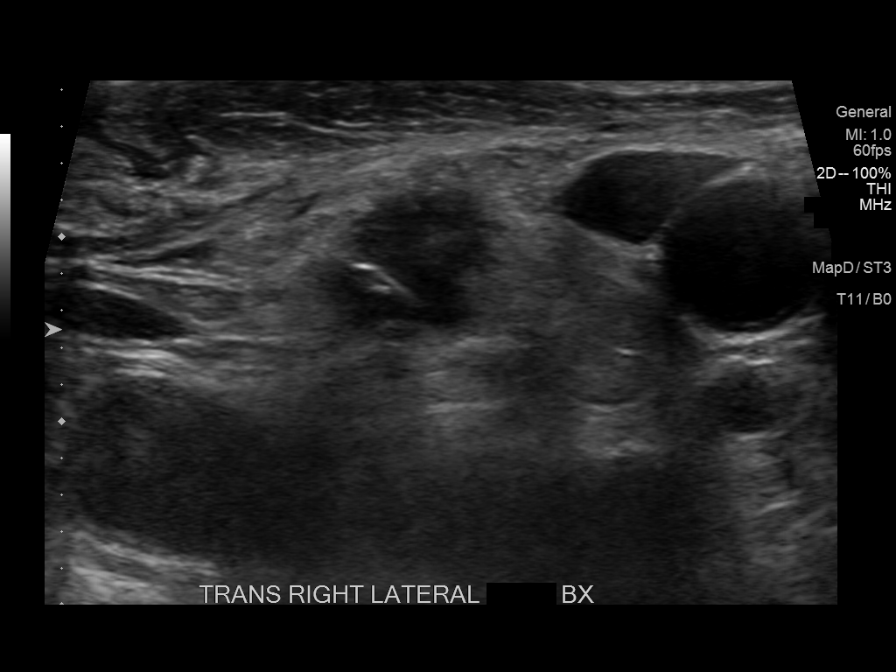
[im 6/9]
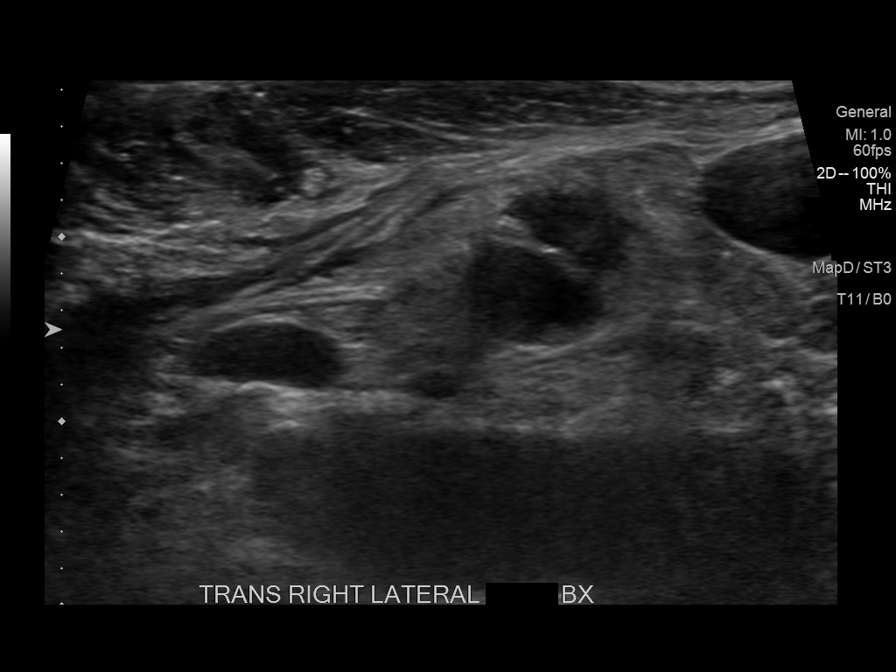
[im 7/9]
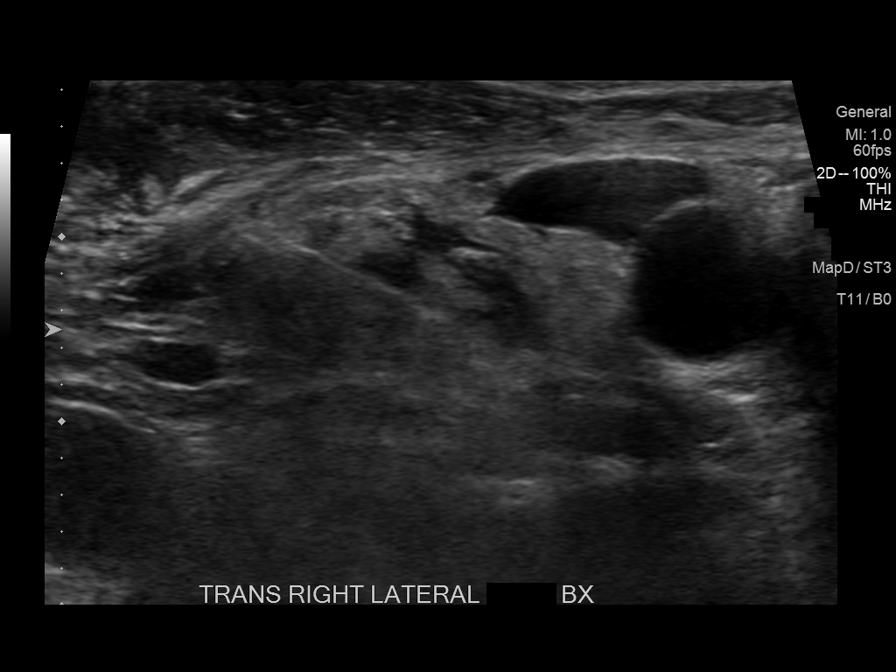
[im 8/9]
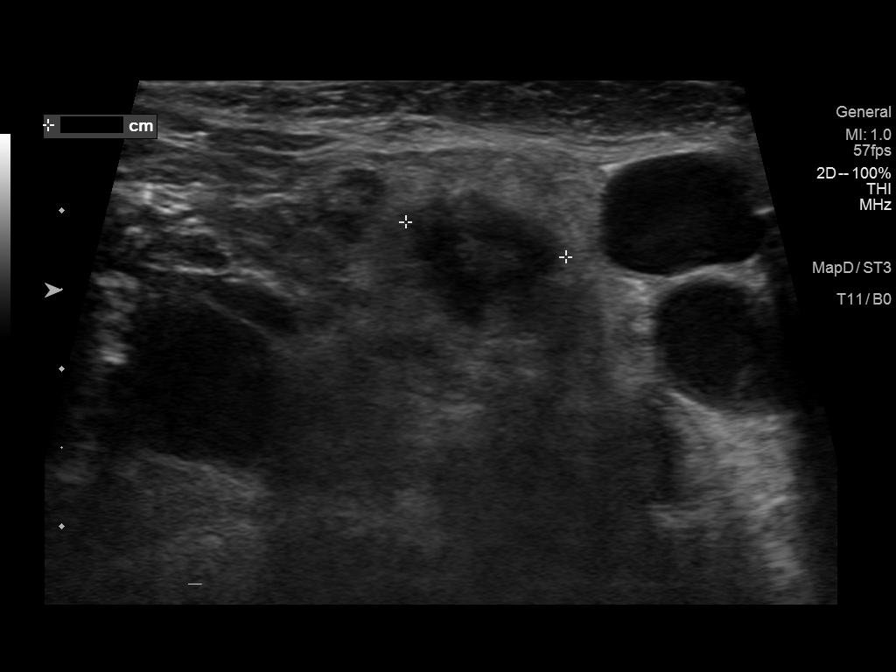
[im 9/9]
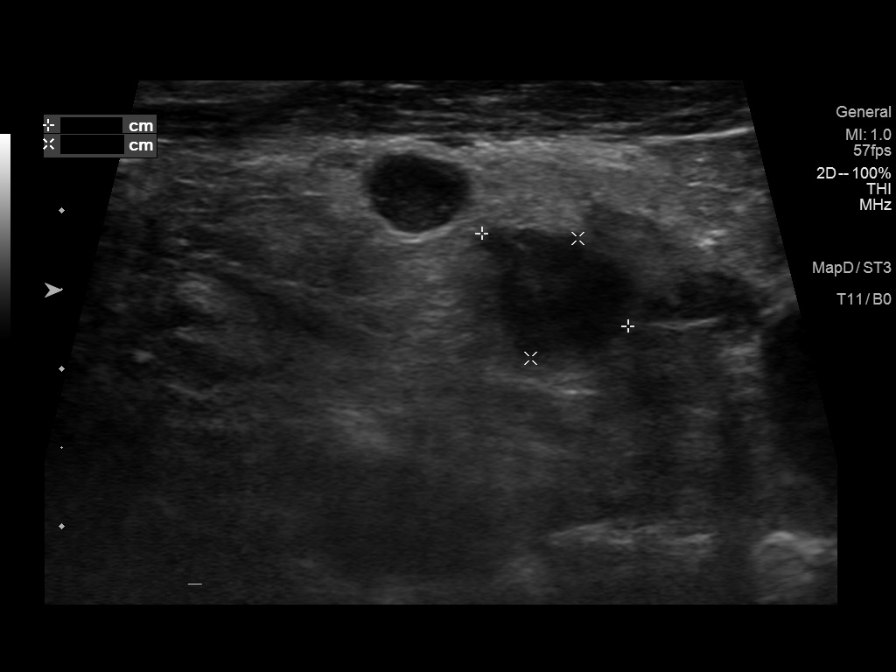

[9 of 9 positions shown; findings below may reference images not displayed]

EXAM:
ULTRASOUND GUIDED CORE BIOPSY OF RIGHT SUPRACLAVICULAR LYMPH NODE

MEDICATIONS:
0.5 mg IV Versed; 25 mcg IV Fentanyl

Total Moderate Sedation Time: 17 minutes.

The patient's level of consciousness and physiologic status were
continuously monitored during the procedure by Radiology nursing.

PROCEDURE:
The procedure, risks, benefits, and alternatives were explained to
the patient. Questions regarding the procedure were encouraged and
answered. The patient understands and consents to the procedure. A
time out was performed prior to initiating the procedure.

The right neck was prepped with chlorhexidine in a sterile fashion,
and a sterile drape was applied covering the operative field. A
sterile gown and sterile gloves were used for the procedure. Local
anesthesia was provided with 1% Lidocaine.

Right supraclavicular/lower cervical lymph nodes were localized.
Four 18 gauge core biopsy samples were obtained through a
supraclavicular lymph node.

COMPLICATIONS:
None.
FINDINGS: Three abnormal appearing right-sided supraclavicular lymph nodes are
identified. The largest measures approximately 1.1 cm. Core biopsy
was obtained through this lymph node.
IMPRESSION: Ultrasound-guided core biopsy performed of an abnormal right
supraclavicular lymph node.
